# Patient Record
Sex: Male | Born: 1955
Health system: Southern US, Community
[De-identification: ages and names within clinical notes are randomized; demographics above are authoritative.]

## PROBLEM LIST (undated history)

## (undated) DIAGNOSIS — J449 Chronic obstructive pulmonary disease, unspecified: Secondary | ICD-10-CM

## (undated) DIAGNOSIS — K219 Gastro-esophageal reflux disease without esophagitis: Secondary | ICD-10-CM

## (undated) DIAGNOSIS — C3490 Malignant neoplasm of unspecified part of unspecified bronchus or lung: Secondary | ICD-10-CM

## (undated) DIAGNOSIS — R011 Cardiac murmur, unspecified: Secondary | ICD-10-CM

## (undated) HISTORY — DX: Chronic obstructive pulmonary disease, unspecified: J44.9

## (undated) HISTORY — PX: PENECTOMY: SHX741

---

## 2020-02-12 ENCOUNTER — Ambulatory Visit (INDEPENDENT_AMBULATORY_CARE_PROVIDER_SITE_OTHER): Payer: Medicare Other

## 2020-02-12 ENCOUNTER — Other Ambulatory Visit: Payer: Self-pay

## 2020-02-12 ENCOUNTER — Ambulatory Visit: Payer: Medicare Other | Admitting: Pulmonary Disease

## 2020-02-12 ENCOUNTER — Encounter: Payer: Self-pay | Admitting: Pulmonary Disease

## 2020-02-12 VITALS — BP 130/68 | HR 84 | Temp 97.6°F | Ht 69.5 in | Wt 165.0 lb

## 2020-02-12 DIAGNOSIS — J449 Chronic obstructive pulmonary disease, unspecified: Secondary | ICD-10-CM

## 2020-02-12 DIAGNOSIS — Z720 Tobacco use: Secondary | ICD-10-CM

## 2020-02-12 LAB — COMPREHENSIVE METABOLIC PANEL
ALT: 10 U/L (ref 0–53)
AST: 17 U/L (ref 0–37)
Albumin: 4.4 g/dL (ref 3.5–5.2)
Alkaline Phosphatase: 85 U/L (ref 39–117)
BUN: 12 mg/dL (ref 6–23)
CO2: 28 mEq/L (ref 19–32)
Calcium: 9.5 mg/dL (ref 8.4–10.5)
Chloride: 103 mEq/L (ref 96–112)
Creatinine, Ser: 1.22 mg/dL (ref 0.40–1.50)
GFR: 61.13 mL/min (ref >60.00)
Glucose, Bld: 94 mg/dL (ref 70–99)
Potassium: 4 mEq/L (ref 3.5–5.1)
Sodium: 136 mEq/L (ref 135–145)
Total Bilirubin: 0.5 mg/dL (ref 0.2–1.2)
Total Protein: 8.1 g/dL (ref 6.0–8.3)

## 2020-02-12 LAB — CBC WITH DIFFERENTIAL/PLATELET
Basophils Absolute: 0.1 10*3/uL (ref 0.0–0.1)
Basophils Relative: 1.2 % (ref 0.0–3.0)
Eosinophils Absolute: 0.1 10*3/uL (ref 0.0–0.7)
Eosinophils Relative: 1.2 % (ref 0.0–5.0)
HCT: 43.8 % (ref 39.0–52.0)
Hemoglobin: 14.7 g/dL (ref 13.0–17.0)
Lymphocytes Relative: 39.1 % (ref 12.0–46.0)
Lymphs Abs: 3.5 10*3/uL (ref 0.7–4.0)
MCHC: 33.7 g/dL (ref 30.0–36.0)
MCV: 93.1 fl (ref 78.0–100.0)
Monocytes Absolute: 0.7 10*3/uL (ref 0.1–1.0)
Monocytes Relative: 7.7 % (ref 3.0–12.0)
Neutro Abs: 4.5 10*3/uL (ref 1.4–7.7)
Neutrophils Relative %: 50.8 % (ref 43.0–77.0)
Platelets: 349 10*3/uL (ref 150.0–400.0)
RBC: 4.7 Mil/uL (ref 4.22–5.81)
RDW: 14.6 % (ref 11.5–15.5)
WBC: 8.9 10*3/uL (ref 4.0–10.5)

## 2020-02-12 MED ORDER — BUDESONIDE-FORMOTEROL FUMARATE 160-4.5 MCG/ACT IN AERO: 1.0000 | INHALATION_SPRAY | Freq: Two times a day (BID) | RESPIRATORY_TRACT | 2 refills | Status: DC

## 2020-02-12 MED ORDER — SPIRIVA RESPIMAT 2.5 MCG/ACT IN AERS: 1.0000 | INHALATION_SPRAY | Freq: Every day | RESPIRATORY_TRACT | 2 refills | Status: DC

## 2020-02-12 MED ORDER — ALBUTEROL SULFATE HFA 108 (90 BASE) MCG/ACT IN AERS: 1.0000 | INHALATION_SPRAY | Freq: Four times a day (QID) | RESPIRATORY_TRACT | 2 refills | Status: DC | PRN

## 2020-02-12 NOTE — Progress Notes (Signed)
Brandon Mckinney    [id number]    [birth year]  Primary Care Physician:Patient, No Pcp Per  Referring Physician: No referring provider defined for this encounter.  Chief complaint: Consult for COPD  HPI: 64 year old with COPD, GERD Self-referred for management of COPD  Diagnosed with COPD around 38 in [place].  Maintained on Symbicort, Spiriva, Ventolin rescue medication Complains of chronic dyspnea on exertion, cough with white mucus.  He recently moved from [place] and is here to establish care.  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  No mold, hot tub, Jacuzzi.  No feather pillows or comforters. Smoking history: 23-pack-year smoker.  Continues to smoke half pack per day Travel history: Grew up in [place].  Moved to [facility] around July 2021 to be closer to family after retirement Relevant family history: No significant family history of lung disease  Outpatient Encounter Medications as of 02/12/2020  Medication Sig  . albuterol (VENTOLIN HFA) 108 (90 Base) MCG/ACT inhaler Inhale 1 puff into the lungs every 6 (six) hours as needed for wheezing or shortness of breath.  . budesonide-formoterol (SYMBICORT) 160-4.5 MCG/ACT inhaler Inhale 1 puff into the lungs 2 (two) times daily.  Marland Kitchen omeprazole (PRILOSEC) 40 MG capsule Take 40 mg by mouth daily.  . Tiotropium Bromide Monohydrate (SPIRIVA RESPIMAT) 2.5 MCG/ACT AERS Inhale 1 puff into the lungs daily.   No facility-administered encounter medications on file as of 02/12/2020.    Allergies as of 02/12/2020  . (No Known Allergies)    Past Medical History:  Diagnosis Date  . COPD (chronic obstructive pulmonary disease) ([place])     History reviewed. No pertinent surgical history.  History reviewed. No pertinent family history.  Social History   Socioeconomic History  . Marital status: Married    Spouse name: Not on file  . Number of children: Not on file  . Years of  education: Not on file  . Highest education level: Not on file  Occupational History  . Not on file  Tobacco Use  . Smoking status: Current Every Day Smoker    Packs/day: 0.50    Types: Cigarettes  . Smokeless tobacco: Never Used  Substance and Sexual Activity  . Alcohol use: Not on file  . Drug use: Not on file  . Sexual activity: Not on file  Other Topics Concern  . Not on file  Social History Narrative  . Not on file   Social Determinants of Health   Financial Resource Strain:   . Difficulty of Paying Living Expenses: Not on file  Food Insecurity:   . Worried About Charity fundraiser in the Last Year: Not on file  . Ran Out of Food in the Last Year: Not on file  Transportation Needs:   . Lack of Transportation (Medical): Not on file  . Lack of Transportation (Non-Medical): Not on file  Physical Activity:   . Days of Exercise per Week: Not on file  . Minutes of Exercise per Session: Not on file  Stress:   . Feeling of Stress : Not on file  Social Connections:   . Frequency of Communication with Friends and Family: Not on file  . Frequency of Social Gatherings with Friends and Family: Not on file  . Attends Religious Services: Not on file  . Active Member of Clubs or Organizations: Not on file  . Attends Archivist Meetings: Not on file  . Marital Status:  Not on file  Intimate Partner Violence:   . Fear of Current or Ex-Partner: Not on file  . Emotionally Abused: Not on file  . Physically Abused: Not on file  . Sexually Abused: Not on file    Review of systems: Review of Systems  Constitutional: Negative for fever and chills.  HENT: Negative.   Eyes: Negative for blurred vision.  Respiratory: as per HPI  Cardiovascular: Negative for chest pain and palpitations.  Gastrointestinal: Negative for vomiting, diarrhea, blood per rectum. Genitourinary: Negative for dysuria, urgency, frequency and hematuria.  Musculoskeletal: Negative for myalgias, back pain  and joint pain.  Skin: Negative for itching and rash.  Neurological: Negative for dizziness, tremors, focal weakness, seizures and loss of consciousness.  Endo/Heme/Allergies: Negative for environmental allergies.  Psychiatric/Behavioral: Negative for depression, suicidal ideas and hallucinations.  All other systems reviewed and are negative.  Physical Exam: Blood pressure 130/68, pulse 84, temperature 97.6 F (36.4 C), temperature source Temporal, height 5' 9.5" (1.765 m), weight 165 lb (74.8 kg), SpO2 95 %. Gen:      No acute distress HEENT:  EOMI, sclera anicteric Neck:     No masses; no thyromegaly Lungs:    Diminished air entry CV:         Regular rate and rhythm; no murmurs Abd:      + bowel sounds; soft, non-tender; no palpable masses, no distension Ext:    No edema; adequate peripheral perfusion Skin:      Warm and dry; no rash Neuro: alert and oriented x 3 Psych: normal mood and affect  Data Reviewed: Imaging:  PFTs:  Labs:  Assessment:  COPD Continue Symbicort, Spiriva and albuterol as needed Check baseline labs including CBC, metabolic panel, IgE and alpha-1 antitrypsin levels and phenotype Schedule chest x-ray and PFTs  Active smoker Smoking cessation discussed but is not ready to quit.  Time spent counseling-5 minutes Reassessment at return visit  Refer for low-dose screening CT of chest.  Plan/Recommendations: Continue current inhalers CBC, metabolic panel, IgE, alpha-1 antitrypsin Chest x-ray and PFTs Refer for annual low-dose screening CT Smoking cessation.  Marshell Garfinkel MD [facility] 02/12/2020, 10:56 AM  CC: No ref. provider found

## 2020-02-12 NOTE — Patient Instructions (Signed)
We will get a chest x-ray today Schedule PFTs Refer for low-dose screening CT of the chest Check CBC differential, IgE, alpha-1 antitrypsin levels and phenotype and comprehensive metabolic panel Send in prescription for Symbicort, Spiriva and Ventolin  Please work on smoking cessation  Follow-up in 3 months

## 2020-02-15 ENCOUNTER — Telehealth: Payer: Self-pay | Admitting: Pulmonary Disease

## 2020-02-16 NOTE — Telephone Encounter (Signed)
Spoke with the pt  He states received a call yesterday, but can not remember who called or why  I do not see where anyone placed a call, his labs and cxr have not been resulted  Advised will call with with results once read  Nothing further needed

## 2020-02-18 ENCOUNTER — Telehealth: Payer: Self-pay | Admitting: Pulmonary Disease

## 2020-02-19 NOTE — Telephone Encounter (Signed)
Spoke with the pt   He states he is calling to schedule appt with Dr Vaughan Browner for Jan 2022  I advised him that the schedule is not out yet and we will let him know when it is  Nothing further needed

## 2020-02-22 LAB — IGE: IgE (Immunoglobulin E), Serum: 14 kU/L (ref <=114)

## 2020-02-22 LAB — ALPHA-1 ANTITRYPSIN PHENOTYPE: A-1 Antitrypsin, Ser: 191 mg/dL (ref 83–199)

## 2020-02-24 ENCOUNTER — Telehealth: Payer: Self-pay | Admitting: Pulmonary Disease

## 2020-02-24 NOTE — Telephone Encounter (Signed)
I had been trying to contact pt regarding his CT.  He was scheduled for 10/22.  Per SG we will hold off until she talks to [facility] tomorrow and will determine if he can be in the [facility] program.  I spoke to pt & explained this to him & told him I will call him back in the next day or 2 and now he has my direct #.  He is fine with this.  Nothing further needed for this message.

## 2020-02-26 ENCOUNTER — Inpatient Hospital Stay: Admission: RE | Admit: 2020-02-26 | Payer: Medicare Other | Source: Ambulatory Visit

## 2020-03-18 ENCOUNTER — Telehealth: Payer: Self-pay | Admitting: General Practice

## 2020-03-18 NOTE — Telephone Encounter (Signed)
Called and spoke with pharmacy to see if pt's symbicort and spiriva were ready for pt to pick up at pharmacy and was told by pharmacy staff that they were ready.  Called and spoke with pt letting him know this info and he verbalized understanding.nothing further needed.

## 2020-03-25 NOTE — Telephone Encounter (Signed)
error 

## 2020-04-14 ENCOUNTER — Other Ambulatory Visit: Payer: Self-pay | Admitting: *Deleted

## 2020-04-14 DIAGNOSIS — F17210 Nicotine dependence, cigarettes, uncomplicated: Secondary | ICD-10-CM

## 2020-04-14 DIAGNOSIS — Z87891 Personal history of nicotine dependence: Secondary | ICD-10-CM

## 2020-04-14 NOTE — Progress Notes (Signed)
Chest c 

## 2020-04-18 ENCOUNTER — Ambulatory Visit: Payer: Medicare Other | Admitting: Pulmonary Disease

## 2020-04-18 ENCOUNTER — Encounter: Payer: Self-pay | Admitting: Pulmonary Disease

## 2020-04-18 ENCOUNTER — Other Ambulatory Visit: Payer: Self-pay

## 2020-04-18 ENCOUNTER — Ambulatory Visit (INDEPENDENT_AMBULATORY_CARE_PROVIDER_SITE_OTHER): Payer: Medicare Other | Admitting: Pulmonary Disease

## 2020-04-18 VITALS — BP 138/62 | HR 81 | Temp 98.8°F | Ht 69.5 in | Wt 166.0 lb

## 2020-04-18 DIAGNOSIS — Z720 Tobacco use: Secondary | ICD-10-CM

## 2020-04-18 DIAGNOSIS — Z23 Encounter for immunization: Secondary | ICD-10-CM | POA: Diagnosis not present

## 2020-04-18 DIAGNOSIS — J449 Chronic obstructive pulmonary disease, unspecified: Secondary | ICD-10-CM

## 2020-04-18 DIAGNOSIS — F17210 Nicotine dependence, cigarettes, uncomplicated: Secondary | ICD-10-CM

## 2020-04-18 LAB — PULMONARY FUNCTION TEST
DL/VA % pred: 48 %
DL/VA: 2.01 ml/min/mmHg/L
DLCO cor % pred: 51 %
DLCO cor: 13.46 ml/min/mmHg
DLCO unc % pred: 51 %
DLCO unc: 13.46 ml/min/mmHg
FEF 25-75 Post: 0.84 L/sec
FEF 25-75 Pre: 0.85 L/sec
FEF2575-%Change-Post: -1 %
FEF2575-%Pred-Post: 32 %
FEF2575-%Pred-Pre: 32 %
FEV1-%Change-Post: 3 %
FEV1-%Pred-Post: 63 %
FEV1-%Pred-Pre: 60 %
FEV1-Post: 1.85 L
FEV1-Pre: 1.79 L
FEV1FVC-%Change-Post: 4 %
FEV1FVC-%Pred-Pre: 67 %
FEV6-%Change-Post: 0 %
FEV6-%Pred-Post: 92 %
FEV6-%Pred-Pre: 93 %
FEV6-Post: 3.41 L
FEV6-Pre: 3.43 L
FEV6FVC-%Change-Post: 0 %
FEV6FVC-%Pred-Post: 104 %
FEV6FVC-%Pred-Pre: 103 %
FVC-%Change-Post: -1 %
FVC-%Pred-Post: 88 %
FVC-%Pred-Pre: 89 %
FVC-Post: 3.41 L
FVC-Pre: 3.45 L
Post FEV1/FVC ratio: 54 %
Post FEV6/FVC ratio: 100 %
Pre FEV1/FVC ratio: 52 %
Pre FEV6/FVC Ratio: 99 %
RV % pred: 118 %
RV: 2.77 L
TLC % pred: 101 %
TLC: 7.05 L

## 2020-04-18 MED ORDER — BUPROPION HCL ER (XL) 150 MG PO TB24: ORAL_TABLET | ORAL | 0 refills | Status: DC

## 2020-04-18 MED ORDER — NICOTINE 21 MG/24HR TD PT24: 21.0000 mg | MEDICATED_PATCH | Freq: Every day | TRANSDERMAL | 0 refills | Status: DC

## 2020-04-18 NOTE — Progress Notes (Signed)
         Brandon Mckinney    [id number]    1956/03/11  Primary Care Physician:Patient, No Pcp Per  Referring Physician: No referring provider defined for this encounter.  Chief complaint: Follow-up for COPD.  HPI: 64 year old with COPD, GERD Self-referred for management of COPD  Diagnosed with COPD around 7 in [place].  Maintained on Symbicort, Spiriva, Ventolin rescue medication Complains of chronic dyspnea on exertion, cough with white mucus.  He recently moved from [place] and is here to establish care.  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  No mold, hot tub, Jacuzzi.  No feather pillows or comforters. Smoking history: 23-pack-year smoker.  Continues to smoke half pack per day Travel history: Grew up in [place].  Moved to [facility] around July 2021 to be closer to family after retirement Relevant family history: No significant family history of lung disease  Interim history: Symptoms are stable on Spiriva and Symbicort.  Continues to smoke.  Outpatient Encounter Medications as of 04/18/2020  Medication Sig  . albuterol (VENTOLIN HFA) 108 (90 Base) MCG/ACT inhaler Inhale 1 puff into the lungs every 6 (six) hours as needed for wheezing or shortness of breath.  . budesonide-formoterol (SYMBICORT) 160-4.5 MCG/ACT inhaler Inhale 1 puff into the lungs 2 (two) times daily.  Marland Kitchen omeprazole (PRILOSEC) 40 MG capsule Take 40 mg by mouth daily.  . Tiotropium Bromide Monohydrate (SPIRIVA RESPIMAT) 2.5 MCG/ACT AERS Inhale 1 puff into the lungs daily.   No facility-administered encounter medications on file as of 04/18/2020.   Physical Exam: Blood pressure 138/62, pulse 81, temperature 98.8 F (37.1 C), temperature source Skin, height 5' 9.5" (1.765 m), weight 166 lb (75.3 kg), SpO2 97 %. Gen:      No acute distress HEENT:  EOMI, sclera anicteric Neck:     No masses; no thyromegaly Lungs:    Clear to auscultation bilaterally; normal  respiratory effort CV:         Regular rate and rhythm; no murmurs Abd:      + bowel sounds; soft, non-tender; no palpable masses, no distension Ext:    No edema; adequate peripheral perfusion Skin:      Warm and dry; no rash Neuro: alert and oriented x 3 Psych: normal mood and affect  Data Reviewed: Imaging: Chest x-ray 12/13/2019-hyperinflation with no acute abnormality.  I have reviewed the images personally.  PFTs: 04/18/2020 FVC 3.41 [88%], FEV1 1.85 [63%], F/F 54, TLC 7.05 [101%], DLCO 13.46 [51%] Moderate obstruction, diffusion defect  Labs: CBC 02/12/2020-WBC 8.9, eos 1.2%, absolute eosinophil count 178 IgE 02/12/2020-14 Alpha-1 antitrypsin 02/12/2020-191, PI MM  Assessment:  COPD Continue Symbicort, Spiriva and albuterol as needed Reviewed PFTs showing moderate obstruction with patient.  Active smoker Smoking cessation discussed.  He is ready to quit at this plan.  Time spent counseling-5 minutes Has not tolerated Chantix in the past due to hallucinations Prescribe nicotine patch, Wellbutrin Reassess at return visit  Starting low-dose screening CTs in January 2021  Plan/Recommendations: Continue current inhalers Smoking cessation with nicotine patches, Wellbutrin Low-dose screening CT  Marshell Garfinkel MD [place] [facility] 04/18/2020, 11:48 AM  CC: No ref. provider found

## 2020-04-18 NOTE — Progress Notes (Signed)
Full PFT performed today. °

## 2020-04-18 NOTE — Patient Instructions (Signed)
Please work on quitting smoking We'll prescribe nicotine patches and Wellbutrin to help with quitting Continue the inhalers as prescribed  Follow-up in 6 months.

## 2020-05-10 ENCOUNTER — Other Ambulatory Visit: Payer: Self-pay | Admitting: Pulmonary Disease

## 2020-05-11 ENCOUNTER — Other Ambulatory Visit: Payer: Self-pay | Admitting: Pulmonary Disease

## 2020-05-12 ENCOUNTER — Telehealth: Payer: Self-pay | Admitting: Pulmonary Disease

## 2020-05-13 NOTE — Telephone Encounter (Signed)
Tried calling pt- line rings multiple times and no option to leave msg.

## 2020-05-18 ENCOUNTER — Other Ambulatory Visit: Payer: Self-pay

## 2020-05-18 ENCOUNTER — Ambulatory Visit
Admission: RE | Admit: 2020-05-18 | Discharge: 2020-05-18 | Disposition: A | Discharge status: Home / Self Care | Payer: Medicare Other | Source: Ambulatory Visit | Attending: Acute Care | Admitting: Acute Care

## 2020-05-18 ENCOUNTER — Encounter: Payer: Self-pay | Admitting: Acute Care

## 2020-05-18 ENCOUNTER — Ambulatory Visit (INDEPENDENT_AMBULATORY_CARE_PROVIDER_SITE_OTHER): Payer: Medicare Other | Admitting: Acute Care

## 2020-05-18 VITALS — BP 118/74 | HR 86 | Temp 97.1°F | Ht 69.5 in | Wt 169.8 lb

## 2020-05-18 DIAGNOSIS — F17210 Nicotine dependence, cigarettes, uncomplicated: Secondary | ICD-10-CM | POA: Diagnosis not present

## 2020-05-18 DIAGNOSIS — Z87891 Personal history of nicotine dependence: Secondary | ICD-10-CM

## 2020-05-18 DIAGNOSIS — Z122 Encounter for screening for malignant neoplasm of respiratory organs: Secondary | ICD-10-CM

## 2020-05-18 NOTE — Patient Instructions (Signed)
Thank you for participating in the [facility]. It was our pleasure to meet you today. We will call you with the results of your scan within the next few days. Your scan will be assigned a Lung RADS category score by the physicians reading the scans.  This Lung RADS score determines follow up scanning.  See below for description of categories, and follow up screening recommendations. We will be in touch to schedule your follow up screening annually or based on recommendations of our providers. We will fax a copy of your scan results to your Primary Care Physician, or the physician who referred you to the program, to ensure they have the results. Please call the office if you have any questions or concerns regarding your scanning experience or results.  Our office number is [phone]. Please speak with Denise Phelps, RN. She is our Lung Cancer Screening RN. If she is unavailable when you call, please have the office staff send her a message. She will return your call at her earliest convenience. Remember, if your scan is normal, we will scan you annually as long as you continue to meet the criteria for the program. (Age 67-77, Current smoker or smoker who has quit within the last 15 years). If you are a smoker, remember, quitting is the single most powerful action that you can take to decrease your risk of lung cancer and other pulmonary, breathing related problems. We know quitting is hard, and we are here to help.  Please let us know if there is anything we can do to help you meet your goal of quitting. If you are a former smoker, congratulations. We are proud of you! Remain smoke free! Remember you can refer friends or family members through the number above.  We will screen them to make sure they meet criteria for the program. Thank you for helping us take better care of you by participating in Lung Screening.  Lung RADS Categories:  Lung RADS 1: no nodules  or definitely non-concerning nodules.  Recommendation is for a repeat annual scan in 12 months.  Lung RADS 2:  nodules that are non-concerning in appearance and behavior with a very low likelihood of becoming an active cancer. Recommendation is for a repeat annual scan in 12 months.  Lung RADS 3: nodules that are probably non-concerning , includes nodules with a low likelihood of becoming an active cancer.  Recommendation is for a 6-month repeat screening scan. Often noted after an upper respiratory illness. We will be in touch to make sure you have no questions, and to schedule your 6-month scan.  Lung RADS 4 A: nodules with concerning findings, recommendation is most often for a follow up scan in 3 months or additional testing based on our provider's assessment of the scan. We will be in touch to make sure you have no questions and to schedule the recommended 3 month follow up scan.  Lung RADS 4 B:  indicates findings that are concerning. We will be in touch with you to schedule additional diagnostic testing based on our provider's  assessment of the scan.   

## 2020-05-18 NOTE — Progress Notes (Signed)
Shared Decision Making Visit Lung Cancer Screening Program [phone])   Eligibility:  Age 65 y.o.  Pack Years Smoking History Calculation 45 pack year smoking history (# packs/per year x # years smoked)  Recent History of coughing up blood  no  Unexplained weight loss? no ( >Than 15 pounds within the last 6 months )  Prior History Lung / other cancer no (Diagnosis within the last 5 years already requiring surveillance chest CT Scans).  Smoking Status: Former smoker >> Quit 2 weeks ago  Former Smokers: Years since quit: < 1 year  Quit Date: 05/04/2020  Visit Components:  Discussion included one or more decision making aids. yes  Discussion included risk/benefits of screening. yes  Discussion included potential follow up diagnostic testing for abnormal scans. yes  Discussion included meaning and risk of over diagnosis. yes  Discussion included meaning and risk of False Positives. yes  Discussion included meaning of total radiation exposure. yes  Counseling Included:  Importance of adherence to annual lung cancer LDCT screening. yes  Impact of comorbidities on ability to participate in the program. yes  Ability and willingness to under diagnostic treatment. yes  Smoking Cessation Counseling:  Current Smokers:   Discussed importance of smoking cessation. yes  Information about tobacco cessation classes and interventions provided to patient. yes  Patient provided with "ticket" for LDCT Scan. yes  Symptomatic Patient. no  Counseling  Diagnosis Code: Tobacco Use Z72.0  Asymptomatic Patient yes  Counseling (Intermediate counseling: > three minutes counseling) V6160  Former Smokers:   Discussed the importance of maintaining cigarette abstinence. yes  Diagnosis Code: Personal History of Nicotine Dependence. V37.106  Information about tobacco cessation classes and interventions provided to patient. Yes  Patient provided with "ticket" for LDCT Scan.  yes  Written Order for Lung Cancer Screening with LDCT placed in Epic. Yes (CT Chest Lung Cancer Screening Low Dose W/O CM) [id number] Z12.2-Screening of respiratory organs Z87.891-Personal history of nicotine dependence  BP 118/74 (BP Location: Left Arm, Cuff Size: Normal)   Pulse 86   Temp (!) 97.1 F (36.2 C) (Oral)   Ht 5' 9.5" (1.765 m)   Wt 169 lb 12.8 oz (77 kg)   SpO2 99%   BMI 24.72 kg/m    I spent 25 minutes of face to face time with Brandon Mckinney discussing the risks and benefits of lung cancer screening. We viewed a power point together that explained in detail the above noted topics. We took the time to pause the power point at intervals to allow for questions to be asked and answered to ensure understanding. We discussed that he had taken the single most powerful action possible to decrease his risk of developing lung cancer when he quit smoking. I counseled him to remain smoke free, and to contact me if he ever had the desire to smoke again so that I can provide resources and tools to help support the effort to remain smoke free. We discussed the time and location of the scan, and that either  Brandon Glassman RN or I will call with the results within  24-48 hours of receiving them. He has my card and contact information in the event he needs to speak with me, in addition to a copy of the power point we reviewed as a resource. He verbalized understanding of all of the above and had no further questions upon leaving the office.     I explained to the patient that there has been a high incidence of coronary artery  disease noted on these exams. I explained that this is a non-gated exam therefore degree or severity cannot be determined. This patient is not currently on statin therapy, per EPIC. I have asked the patient to follow-up with their PCP regarding any incidental finding of coronary artery disease and management with diet or medication as they feel is clinically indicated. The patient  verbalized understanding of the above and had no further questions.     Brandon Spatz, NP 05/18/2020 11:26 AM

## 2020-05-19 ENCOUNTER — Other Ambulatory Visit: Payer: Self-pay | Admitting: Acute Care

## 2020-05-19 DIAGNOSIS — E041 Nontoxic single thyroid nodule: Secondary | ICD-10-CM

## 2020-05-19 NOTE — Progress Notes (Signed)
Please call patient and let them  know their  low dose Ct was read as a Lung RADS 1, negative study: no nodules or definitely benign nodules. Radiology recommendation is for a repeat LDCT in 12 months.  .Please let them  know we will order and schedule their  annual screening scan for 05/2021. Please let them  know there was notation of CAD on their  scan.  Please remind the patient  that this is a non-gated exam therefore degree or severity of disease  cannot be determined. Please have them  follow up with their PCP regarding potential risk factor modification, dietary therapy or pharmacologic therapy if clinically indicated. Pt.  is not  currently on statin therapy. Please place order for annual  screening scan for  05/2021 and fax results to PCP. Thanks so much.  Langley Gauss, I have called the patient . I have told him about the thyroid nodule. He does not have a PCP. I am placing an order >> ambulatory referral to Primary care, and I am going to order the US thyroid.  Please place order for follow up in 1 year. We will get him a PCP.

## 2020-05-20 NOTE — Telephone Encounter (Signed)
lmtcb for pt.  May need to try for pt assistance instead of tier exceptions, as if some are approved while others arent then it may still be more than pt is able to afford. Patient assistance seems to be the best choice for pt not being able to afford his medication.

## 2020-05-23 ENCOUNTER — Telehealth: Payer: Self-pay | Admitting: General Practice

## 2020-05-23 NOTE — Telephone Encounter (Signed)
Left message voicemail for patient to call for appointment New patient referral in queue

## 2020-05-24 ENCOUNTER — Other Ambulatory Visit: Payer: Self-pay | Admitting: *Deleted

## 2020-05-24 DIAGNOSIS — Z87891 Personal history of nicotine dependence: Secondary | ICD-10-CM

## 2020-05-24 NOTE — Telephone Encounter (Addendum)
  I have printed the pt assistance forms and have them in triage. We do not mail samples of Spiriva. Need to know if he wants to pick up one, and if so does he just want to pick up forms then as well. Tried calling the pt and there was no answer and no VM, WCB

## 2020-05-24 NOTE — Telephone Encounter (Signed)
(  Pts wife has similar message) Called and spoke to pt's wife, Brandon Mckinney. Informed her of the pt assistance for the medication. She is agreeable. Pt is needing Symbicort, Spiriva, Albuterol and Omeprazole. She is requesting the forms be mailed to her for pt. Pt also only has a few days of medication left. Advised her we only have Spiriva samples at this time. She states she isnt feeling well currently and is requesting the forms and the Spiriva inhaler to be mailed to pt. Advised her this could be done.   Will forward to triage to send out pt assistance documents for all aforementioned forms and the spiriva inhaler sample. Please include the fax number in the envelope as she will fax back the completed forms.

## 2020-06-02 MED ORDER — SPIRIVA RESPIMAT 2.5 MCG/ACT IN AERS: 2.0000 | INHALATION_SPRAY | Freq: Every day | RESPIRATORY_TRACT | 0 refills | Status: DC

## 2020-06-02 NOTE — Telephone Encounter (Signed)
spiriva 2.5 samples have been placed up front for pt. Nothing further needed.

## 2020-06-02 NOTE — Telephone Encounter (Signed)
Called and spoke to pt's wife. She is aware the Spiriva cannot be mailed. Pt is ok picking up the sample (see message on pt's wife, she has same request). Pt is requesting all forms be faxed to [phone]. Pt's wife is requesting the samples be ready for pick up between 2-3p. Please call pt if samples cannot be placed up front between this time.

## 2020-06-06 ENCOUNTER — Ambulatory Visit
Admission: RE | Admit: 2020-06-06 | Discharge: 2020-06-06 | Disposition: A | Discharge status: Home / Self Care | Payer: Medicare Other | Source: Ambulatory Visit | Attending: Acute Care | Admitting: Acute Care

## 2020-06-06 DIAGNOSIS — E041 Nontoxic single thyroid nodule: Secondary | ICD-10-CM

## 2020-06-16 ENCOUNTER — Other Ambulatory Visit: Payer: Self-pay | Admitting: Acute Care

## 2020-06-16 DIAGNOSIS — E041 Nontoxic single thyroid nodule: Secondary | ICD-10-CM

## 2020-06-16 NOTE — Progress Notes (Signed)
I have called the patient with the results of his Thyroid US.I explained that the inferior left thyroid lobe was categorized as moderately suspicious, and recommendation was for a fine needle aspiration , or biopsy. I have told him I will place a referral to ENT for evaluation and follow up diagnostics. Additionally, we discussed that he needs to establish with a Primary Care Physician. I will also place an order for referral to PCP. I have told him this nodule needs definite follow up, and that if her does not get a call back from an ENT by 2/16 he needs to call our office at [phone] so that we can follow up on the referral. Pt. Verbalized understanding of the above.

## 2020-06-16 NOTE — Progress Notes (Signed)
I have called the patient to let him know there is notation on the previous referral to Primary care that they had called his phone and left a voice mail. I have asked him to follow up on that voice mail so that he can establish with primary care. I have also let him know that Primary Care at [facility] is accepting new patients. I have encouraged him to use this as an option also.

## 2020-07-15 ENCOUNTER — Other Ambulatory Visit: Payer: Self-pay | Admitting: Otolaryngology

## 2020-07-15 DIAGNOSIS — E041 Nontoxic single thyroid nodule: Secondary | ICD-10-CM

## 2020-07-27 ENCOUNTER — Ambulatory Visit
Admission: RE | Admit: 2020-07-27 | Discharge: 2020-07-27 | Disposition: A | Discharge status: Home / Self Care | Payer: Medicare Other | Source: Ambulatory Visit | Attending: Otolaryngology | Admitting: Otolaryngology

## 2020-07-27 ENCOUNTER — Other Ambulatory Visit (HOSPITAL_COMMUNITY)
Admission: RE | Admit: 2020-07-27 | Discharge: 2020-07-27 | Disposition: A | Discharge status: Home / Self Care | Payer: Medicare Other | Source: Ambulatory Visit | Attending: Otolaryngology | Admitting: Otolaryngology

## 2020-07-27 DIAGNOSIS — E041 Nontoxic single thyroid nodule: Secondary | ICD-10-CM | POA: Diagnosis present

## 2020-07-27 DIAGNOSIS — D440 Neoplasm of uncertain behavior of thyroid gland: Secondary | ICD-10-CM | POA: Diagnosis not present

## 2020-07-28 LAB — CYTOLOGY - NON PAP

## 2020-08-12 ENCOUNTER — Telehealth: Payer: Self-pay | Admitting: Acute Care

## 2020-08-12 NOTE — Telephone Encounter (Signed)
It has been sent for afirma testing. I do not have a result back. Margie, can you look for results and let me know if you find it? If we can find the results I will call the patient later today.Thanks

## 2020-08-12 NOTE — Telephone Encounter (Signed)
Spoke to patient, who is requesting results of thyroid bx.  Sarah, please advise? Thanks

## 2020-08-12 NOTE — Telephone Encounter (Signed)
I am unable to locate results.   Patient is aware that results are still pending.

## 2020-08-16 ENCOUNTER — Encounter (HOSPITAL_COMMUNITY): Payer: Self-pay

## 2020-08-17 NOTE — Telephone Encounter (Signed)
Searched patient's chart in Care Everywhere. Was able to find this information:   "Formatting of this note might be different from the original. Called patient to discuss results of recent thyroid biopsy and subsequent Afirma testing. The left lower lobe thyroid nodule measuring 4.0 cm was benign on Afirma testing. Patient was counseled that this indicates a risk of malignancy of approximately 4% and was reassured. He denies development of any symptoms. I advised continued clinical monitoring with a repeat thyroid ultrasound in 1 year based on current ATA guidelines. Patient expressed understanding and agreement.   Electronically signed by: Maryan Rued, DO 08/12/20 1205"

## 2020-08-24 NOTE — Telephone Encounter (Signed)
Sarah, please advise on results cherina found. Thanks!

## 2020-08-25 NOTE — Telephone Encounter (Signed)
I have called the patient and confirmed that he was called the report of a benign thyroid nodule by Dr. Mirian Mo Skotnicki. He confirmed he received the results. He also confirmed need for another [place] in 12 months. He and his wife have an appointment with a PCP within the next 5 days to establish care. He verbalized understanding of the above and had no further questions at completion of the call.  He will be due for lung cancer screening 05/2021

## 2020-09-13 ENCOUNTER — Ambulatory Visit: Payer: Medicare Other | Admitting: Internal Medicine

## 2020-09-27 ENCOUNTER — Ambulatory Visit (INDEPENDENT_AMBULATORY_CARE_PROVIDER_SITE_OTHER): Payer: Medicare Other | Admitting: Emergency Medicine

## 2020-09-27 ENCOUNTER — Other Ambulatory Visit: Payer: Self-pay

## 2020-09-27 ENCOUNTER — Encounter: Payer: Self-pay | Admitting: Emergency Medicine

## 2020-09-27 VITALS — BP 132/70 | HR 83 | Temp 98.1°F | Ht 69.0 in | Wt 163.0 lb

## 2020-09-27 DIAGNOSIS — E041 Nontoxic single thyroid nodule: Secondary | ICD-10-CM

## 2020-09-27 DIAGNOSIS — F17200 Nicotine dependence, unspecified, uncomplicated: Secondary | ICD-10-CM

## 2020-09-27 DIAGNOSIS — J439 Emphysema, unspecified: Secondary | ICD-10-CM

## 2020-09-27 DIAGNOSIS — I700 Atherosclerosis of aorta: Secondary | ICD-10-CM

## 2020-09-27 DIAGNOSIS — Z7689 Persons encountering health services in other specified circumstances: Secondary | ICD-10-CM | POA: Diagnosis not present

## 2020-09-27 DIAGNOSIS — Z8719 Personal history of other diseases of the digestive system: Secondary | ICD-10-CM

## 2020-09-27 MED ORDER — OMEPRAZOLE 40 MG PO CPDR: 40.0000 mg | DELAYED_RELEASE_CAPSULE | Freq: Every day | ORAL | 3 refills | Status: DC

## 2020-09-27 MED ORDER — ROSUVASTATIN CALCIUM 10 MG PO TABS: 10.0000 mg | ORAL_TABLET | Freq: Every day | ORAL | 3 refills | Status: DC

## 2020-09-27 NOTE — Assessment & Plan Note (Signed)
Benign nodule.  No treatment necessary at this time.

## 2020-09-27 NOTE — Progress Notes (Signed)
Brandon Mckinney 65 y.o.   Chief Complaint  Patient presents with  . New Patient (Initial Visit)    Pt would like a refill on prilosec and a general health check    HISTORY OF PRESENT ILLNESS: This is a 65 y.o. male first visit to this office here to establish care with me. Does not have a local primary care physician. Chronic smoker with history of COPD.  Has seen a pulmonary local doctor. Recent screening CT chest showed incidental finding of left thyroid nodule.  Was seen by ENT Dr. Had fine-needle aspiration done with negative biopsy results.  Continues to smoke. No other complaints or medical concerns today. Most recent pulmonary office visit as follows: Assessment:  COPD Continue Symbicort, Spiriva and albuterol as needed Check baseline labs including CBC, metabolic panel, IgE and alpha-1 antitrypsin levels and phenotype Schedule chest x-ray and PFTs   Active smoker Smoking cessation discussed but is not ready to quit.  Time spent counseling-5 minutes Reassessment at return visit   Refer for low-dose screening CT of chest.   Plan/Recommendations: Continue current inhalers CBC, metabolic panel, IgE, alpha-1 antitrypsin Chest x-ray and PFTs Refer for annual low-dose screening CT Smoking cessation.   Marshell Garfinkel MD [facility] 02/12/2020, 10:56 AM Recent CT chest as follows: IMPRESSION: 1. Lung-RADS 1, negative. Continue annual screening with low-dose chest CT without contrast in 12 months. 2. Suggestion of a 2.8 cm left thyroid nodule. Recommend thyroid US (ref: J Am Coll Radiol. 2015 Feb;12(2): 143-50). 3. Aortic Atherosclerosis (ICD10-I70.0) and Emphysema (ICD10-J43.9).   Recent fine-needle aspiration of thyroid nodule report as follows: IMPRESSION:  Nodule 1 (TI-RADS 4), located in the inferior left thyroid lobe,  measuring 4.0 x 2.9 x 2.4 cm meets criteria for FNA.   Procedures:  Flexible Laryngoscopy Procedure Note  Indications:  Tobacco use history, thyroid nodule, hoarseness  Risks, benefits and clinical relevance of the study were discussed. The patient understands and agrees to proceed.   Procedure: After adequate topical anesthetic was applied, 4 mm flexible laryngoscope was passed through the nasal cavity without difficulty. Flexible laryngoscopy shows patent anterior nasal cavity with minimal crusting, no discharge or infection.  Normal base of tongue and supraglottis. Mild interarytenoid edema. Normal vocal cord mobility without vocal cord nodule, mass, polyp or tumor. Supraglottic compression noted with phonation. Hypopharynx normal without mass, pooling of secretions or aspiration.  Patient tolerated procedure without complication or difficulty.  Impression & Plans:  Keanthony Poole is a 65 y.o. male with extensive tobacco use history presenting with incidental finding of TI-RADS 4 thyroid nodule of the right lower lobe on recent screening CT chest. Flexible nasolaryngoscopy performed today demonstrates mobile true vocal folds bilaterally with no evidence of mass lesion. Glottic compression noted with phonation, mild interarytenoid edema also noted consistent with reflux change. Recommend proceeding with ultrasound-guided FNA of thyroid nodule, further recommendations pending results of biopsy. FNA ordered today; I will call patient to discuss results and further treatment plans.   Meghan Donia Pounds, DO Otolaryngology   HPI   Prior to Admission medications   Medication Sig Start Date End Date Taking? Authorizing Provider  albuterol (VENTOLIN HFA) 108 (90 Base) MCG/ACT inhaler Inhale 1 puff into the lungs every 6 (six) hours as needed for wheezing or shortness of breath. 02/12/20  Yes Mannam, Praveen, MD  buPROPion (WELLBUTRIN XL) 150 MG 24 hr tablet TAKE 1 TABLET TWICE DAILY 05/11/20  Yes Mannam, Praveen, MD  omeprazole (PRILOSEC) 40 MG capsule Take 40 mg  by mouth daily.   Yes [provider]   SPIRIVA RESPIMAT 2.5 MCG/ACT AERS TAKE 1 PUFF BY MOUTH EVERY DAY 05/11/20  Yes Mannam, Praveen, MD  SYMBICORT 160-4.5 MCG/ACT inhaler TAKE 1 PUFF BY MOUTH TWICE A DAY 05/11/20  Yes Mannam, Praveen, MD  Tiotropium Bromide Monohydrate (SPIRIVA RESPIMAT) 2.5 MCG/ACT AERS Inhale 2 puffs into the lungs daily. 06/02/20  Yes Mannam, Praveen, MD  nicotine (NICODERM CQ - DOSED IN MG/24 HOURS) 21 mg/24hr patch Place 1 patch (21 mg total) onto the skin daily. Patient not taking: Reported on 09/27/2020 04/18/20   Marshell Garfinkel, MD    No Known Allergies  There are no problems to display for this patient.   Past Medical History:  Diagnosis Date  . COPD (chronic obstructive pulmonary disease) ([place])     Past Surgical History:  Procedure Laterality Date  . PENECTOMY      Social History   Socioeconomic History  . Marital status: Married    Spouse name: Not on file  . Number of children: Not on file  . Years of education: Not on file  . Highest education level: Not on file  Occupational History  . Not on file  Tobacco Use  . Smoking status: Former Smoker    Packs/day: 1.00    Years: 45.00    Pack years: 45.00    Types: Cigarettes    Start date: 1976    Quit date: 05/04/2020    Years since quitting: 0.4  . Smokeless tobacco: Never Used  . Tobacco comment: not smoking since 05/04/2020  Substance and Sexual Activity  . Alcohol use: Not on file  . Drug use: Not on file  . Sexual activity: Not on file  Other Topics Concern  . Not on file  Social History Narrative  . Not on file   Social Determinants of Health   Financial Resource Strain: Not on file  Food Insecurity: Not on file  Transportation Needs: Not on file  Physical Activity: Not on file  Stress: Not on file  Social Connections: Not on file  Intimate Partner Violence: Not on file    History reviewed. No pertinent family history.   Review of Systems  Constitutional: Negative.  Negative for chills and fever.  HENT:  Negative.  Negative for congestion and sore throat.   Respiratory: Negative.  Negative for cough and shortness of breath.   Cardiovascular: Negative.  Negative for chest pain and palpitations.  Gastrointestinal: Negative.  Negative for abdominal pain, diarrhea, nausea and vomiting.  Genitourinary: Negative.  Negative for dysuria and hematuria.  Musculoskeletal: Negative.  Negative for back pain, joint pain and myalgias.  Skin: Negative.  Negative for rash.  Neurological: Negative.  Negative for dizziness and headaches.  All other systems reviewed and are negative.    Today's Vitals   09/27/20 1615  BP: 132/70  Pulse: 83  Temp: 98.1 F (36.7 C)  TempSrc: Oral  SpO2: 97%  Weight: 163 lb (73.9 kg)  Height: 5\' 9"  (1.753 m)   Body mass index is 24.07 kg/m.   Physical Exam Vitals reviewed.  Constitutional:      Appearance: Normal appearance.  HENT:     Head: Normocephalic.  Eyes:     Extraocular Movements: Extraocular movements intact.     Pupils: Pupils are equal, round, and reactive to light.  Cardiovascular:     Rate and Rhythm: Normal rate and regular rhythm.     Pulses: Normal pulses.     Heart sounds: Normal  heart sounds.  Pulmonary:     Effort: Pulmonary effort is normal.     Breath sounds: Normal breath sounds.  Musculoskeletal:        General: Normal range of motion.     Cervical back: Normal range of motion and neck supple.  Skin:    General: Skin is warm and dry.     Capillary Refill: Capillary refill takes less than 2 seconds.  Neurological:     General: No focal deficit present.     Mental Status: He is alert and oriented to person, place, and time.  Psychiatric:        Mood and Affect: Mood normal.        Behavior: Behavior normal.      ASSESSMENT & PLAN: A total of 45 minutes was spent with the patient and counseling/coordination of care regarding establishing care with me, review of past medical history including COPD on thyroid nodules, review of  recent CT chest findings including aortic atherosclerosis and need for starting treatment, education on nutrition, review of all medications, health maintenance items, prognosis, documentation, need for follow-up.  Pulmonary emphysema ([place]) Well-controlled.  Continue Symbicort, Spiriva, and Ventolin as needed.  Atherosclerosis of aorta (HCC) Will start rosuvastatin 10 mg daily.  Diet and nutrition discussed.  Current smoker Smoking cessation advice given.  Thyroid nodule Benign nodule.  No treatment necessary at this time.  Rual was seen today for new patient (initial visit).  Diagnoses and all orders for this visit:  Pulmonary emphysema, unspecified emphysema type ([place])  Thyroid nodule  Encounter to establish care  Current smoker  Atherosclerosis of aorta (HCC) -     rosuvastatin (CRESTOR) 10 MG tablet; Take 1 tablet (10 mg total) by mouth daily.  History of gastroesophageal reflux (GERD) -     omeprazole (PRILOSEC) 40 MG capsule; Take 1 capsule (40 mg total) by mouth daily.    Patient Instructions   Health Maintenance After Age 37 After age 3, you are at a higher risk for certain long-term diseases and infections as well as injuries from falls. Falls are a major cause of broken bones and head injuries in people who are older than age 29. Getting regular preventive care can help to keep you healthy and well. Preventive care includes getting regular testing and making lifestyle changes as recommended by your health care provider. Talk with your health care provider about:  Which screenings and tests you should have. A screening is a test that checks for a disease when you have no symptoms.  A diet and exercise plan that is right for you. What should I know about screenings and tests to prevent falls? Screening and testing are the best ways to find a health problem early. Early diagnosis and treatment give you the best chance of managing medical conditions that are common  after age 46. Certain conditions and lifestyle choices may make you more likely to have a fall. Your health care provider may recommend:  Regular vision checks. Poor vision and conditions such as cataracts can make you more likely to have a fall. If you wear glasses, make sure to get your prescription updated if your vision changes.  Medicine review. Work with your health care provider to regularly review all of the medicines you are taking, including over-the-counter medicines. Ask your health care provider about any side effects that may make you more likely to have a fall. Tell your health care provider if any medicines that you take make you feel  dizzy or sleepy.  Osteoporosis screening. Osteoporosis is a condition that causes the bones to get weaker. This can make the bones weak and cause them to break more easily.  Blood pressure screening. Blood pressure changes and medicines to control blood pressure can make you feel dizzy.  Strength and balance checks. Your health care provider may recommend certain tests to check your strength and balance while standing, walking, or changing positions.  Foot health exam. Foot pain and numbness, as well as not wearing proper footwear, can make you more likely to have a fall.  Depression screening. You may be more likely to have a fall if you have a fear of falling, feel emotionally low, or feel unable to do activities that you used to do.  Alcohol use screening. Using too much alcohol can affect your balance and may make you more likely to have a fall. What actions can I take to lower my risk of falls? General instructions  Talk with your health care provider about your risks for falling. Tell your health care provider if: ? You fall. Be sure to tell your health care provider about all falls, even ones that seem minor. ? You feel dizzy, sleepy, or off-balance.  Take over-the-counter and prescription medicines only as told by your health care  provider. These include any supplements.  Eat a healthy diet and maintain a healthy weight. A healthy diet includes low-fat dairy products, low-fat (lean) meats, and fiber from whole grains, beans, and lots of fruits and vegetables. Home safety  Remove any tripping hazards, such as rugs, cords, and clutter.  Install safety equipment such as grab bars in bathrooms and safety rails on stairs.  Keep rooms and walkways well-lit. Activity  Follow a regular exercise program to stay fit. This will help you maintain your balance. Ask your health care provider what types of exercise are appropriate for you.  If you need a cane or walker, use it as recommended by your health care provider.  Wear supportive shoes that have nonskid soles.   Lifestyle  Do not drink alcohol if your health care provider tells you not to drink.  If you drink alcohol, limit how much you have: ? 0-1 drink a day for women. ? 0-2 drinks a day for men.  Be aware of how much alcohol is in your drink. In the [place] one drink equals one typical bottle of beer (12 oz), one-half glass of wine (5 oz), or one shot of hard liquor (1 oz).  Do not use any products that contain nicotine or tobacco, such as cigarettes and e-cigarettes. If you need help quitting, ask your health care provider. Summary  Having a healthy lifestyle and getting preventive care can help to protect your health and wellness after age 55.  Screening and testing are the best way to find a health problem early and help you avoid having a fall. Early diagnosis and treatment give you the best chance for managing medical conditions that are more common for people who are older than age 70.  Falls are a major cause of broken bones and head injuries in people who are older than age 47. Take precautions to prevent a fall at home.  Work with your health care provider to learn what changes you can make to improve your health and wellness and to prevent falls. This  information is not intended to replace advice given to you by your health care provider. Make sure you discuss any questions you have with  your health care provider. Document Revised: 08/14/2018 Document Reviewed: 03/06/2017 Elsevier Patient Education  2021 [place], MD [facility]

## 2020-09-27 NOTE — Assessment & Plan Note (Signed)
Smoking cessation advice given.

## 2020-09-27 NOTE — Patient Instructions (Signed)
Health Maintenance After Age 67 After age 67, you are at a higher risk for certain long-term diseases and infections as well as injuries from falls. Falls are a major cause of broken bones and head injuries in people who are older than age 67. Getting regular preventive care can help to keep you healthy and well. Preventive care includes getting regular testing and making lifestyle changes as recommended by your health care provider. Talk with your health care provider about:  Which screenings and tests you should have. A screening is a test that checks for a disease when you have no symptoms.  A diet and exercise plan that is right for you. What should I know about screenings and tests to prevent falls? Screening and testing are the best ways to find a health problem early. Early diagnosis and treatment give you the best chance of managing medical conditions that are common after age 67. Certain conditions and lifestyle choices may make you more likely to have a fall. Your health care provider may recommend:  Regular vision checks. Poor vision and conditions such as cataracts can make you more likely to have a fall. If you wear glasses, make sure to get your prescription updated if your vision changes.  Medicine review. Work with your health care provider to regularly review all of the medicines you are taking, including over-the-counter medicines. Ask your health care provider about any side effects that may make you more likely to have a fall. Tell your health care provider if any medicines that you take make you feel dizzy or sleepy.  Osteoporosis screening. Osteoporosis is a condition that causes the bones to get weaker. This can make the bones weak and cause them to break more easily.  Blood pressure screening. Blood pressure changes and medicines to control blood pressure can make you feel dizzy.  Strength and balance checks. Your health care provider may recommend certain tests to check your  strength and balance while standing, walking, or changing positions.  Foot health exam. Foot pain and numbness, as well as not wearing proper footwear, can make you more likely to have a fall.  Depression screening. You may be more likely to have a fall if you have a fear of falling, feel emotionally low, or feel unable to do activities that you used to do.  Alcohol use screening. Using too much alcohol can affect your balance and may make you more likely to have a fall. What actions can I take to lower my risk of falls? General instructions  Talk with your health care provider about your risks for falling. Tell your health care provider if: ? You fall. Be sure to tell your health care provider about all falls, even ones that seem minor. ? You feel dizzy, sleepy, or off-balance.  Take over-the-counter and prescription medicines only as told by your health care provider. These include any supplements.  Eat a healthy diet and maintain a healthy weight. A healthy diet includes low-fat dairy products, low-fat (lean) meats, and fiber from whole grains, beans, and lots of fruits and vegetables. Home safety  Remove any tripping hazards, such as rugs, cords, and clutter.  Install safety equipment such as grab bars in bathrooms and safety rails on stairs.  Keep rooms and walkways well-lit. Activity  Follow a regular exercise program to stay fit. This will help you maintain your balance. Ask your health care provider what types of exercise are appropriate for you.  If you need a cane or walker,   use it as recommended by your health care provider.  Wear supportive shoes that have nonskid soles.   Lifestyle  Do not drink alcohol if your health care provider tells you not to drink.  If you drink alcohol, limit how much you have: ? 0-1 drink a day for women. ? 0-2 drinks a day for men.  Be aware of how much alcohol is in your drink. In the [place] one drink equals one typical bottle of beer (12  oz), one-half glass of wine (5 oz), or one shot of hard liquor (1 oz).  Do not use any products that contain nicotine or tobacco, such as cigarettes and e-cigarettes. If you need help quitting, ask your health care provider. Summary  Having a healthy lifestyle and getting preventive care can help to protect your health and wellness after age 67.  Screening and testing are the best way to find a health problem early and help you avoid having a fall. Early diagnosis and treatment give you the best chance for managing medical conditions that are more common for people who are older than age 67.  Falls are a major cause of broken bones and head injuries in people who are older than age 67. Take precautions to prevent a fall at home.  Work with your health care provider to learn what changes you can make to improve your health and wellness and to prevent falls. This information is not intended to replace advice given to you by your health care provider. Make sure you discuss any questions you have with your health care provider. Document Revised: 08/14/2018 Document Reviewed: 03/06/2017 Elsevier Patient Education  2021 [employer]vier Inc[employer]  

## 2020-09-27 NOTE — Assessment & Plan Note (Signed)
Will start rosuvastatin 10 mg daily.  Diet and nutrition discussed.

## 2020-09-27 NOTE — Assessment & Plan Note (Signed)
Well-controlled.  Continue Symbicort, Spiriva, and Ventolin as needed.

## 2020-10-04 ENCOUNTER — Other Ambulatory Visit: Payer: Self-pay | Admitting: Pulmonary Disease

## 2020-12-30 ENCOUNTER — Other Ambulatory Visit: Payer: Self-pay | Admitting: Emergency Medicine

## 2020-12-30 DIAGNOSIS — Z8719 Personal history of other diseases of the digestive system: Secondary | ICD-10-CM

## 2021-03-31 ENCOUNTER — Other Ambulatory Visit: Payer: Self-pay | Admitting: Emergency Medicine

## 2021-03-31 DIAGNOSIS — Z8719 Personal history of other diseases of the digestive system: Secondary | ICD-10-CM

## 2021-04-03 ENCOUNTER — Ambulatory Visit: Payer: Medicare Other | Admitting: Emergency Medicine

## 2021-06-03 ENCOUNTER — Other Ambulatory Visit: Payer: Self-pay | Admitting: Pulmonary Disease

## 2021-06-23 ENCOUNTER — Other Ambulatory Visit: Payer: Self-pay | Admitting: Pulmonary Disease

## 2021-06-25 ENCOUNTER — Other Ambulatory Visit: Payer: Self-pay | Admitting: Pulmonary Disease

## 2021-06-27 ENCOUNTER — Telehealth: Payer: Self-pay | Admitting: Pulmonary Disease

## 2021-06-27 ENCOUNTER — Other Ambulatory Visit: Payer: Self-pay | Admitting: *Deleted

## 2021-06-27 DIAGNOSIS — Z87891 Personal history of nicotine dependence: Secondary | ICD-10-CM

## 2021-06-27 DIAGNOSIS — F17210 Nicotine dependence, cigarettes, uncomplicated: Secondary | ICD-10-CM

## 2021-06-27 MED ORDER — BUDESONIDE-FORMOTEROL FUMARATE 160-4.5 MCG/ACT IN AERO: INHALATION_SPRAY | RESPIRATORY_TRACT | 0 refills | Status: DC

## 2021-06-27 NOTE — Telephone Encounter (Signed)
Patient last seen 04/2020.  Spoke to patient, who is requesting a refill on Symbicort. He is aware that an appt is needed for further refills.  Once month supply sent to [facility].  Appt scheduled 06/30/2021 at 9:45. Nothing further needed.

## 2021-06-30 ENCOUNTER — Other Ambulatory Visit: Payer: Self-pay

## 2021-06-30 ENCOUNTER — Ambulatory Visit: Payer: Medicare Other | Admitting: Pulmonary Disease

## 2021-06-30 ENCOUNTER — Encounter: Payer: Self-pay | Admitting: Pulmonary Disease

## 2021-06-30 VITALS — BP 128/64 | HR 87 | Temp 98.4°F | Ht 69.5 in | Wt 160.4 lb

## 2021-06-30 DIAGNOSIS — Z8719 Personal history of other diseases of the digestive system: Secondary | ICD-10-CM

## 2021-06-30 DIAGNOSIS — J449 Chronic obstructive pulmonary disease, unspecified: Secondary | ICD-10-CM | POA: Diagnosis not present

## 2021-06-30 DIAGNOSIS — F17210 Nicotine dependence, cigarettes, uncomplicated: Secondary | ICD-10-CM | POA: Diagnosis not present

## 2021-06-30 MED ORDER — OMEPRAZOLE 40 MG PO CPDR: 40.0000 mg | DELAYED_RELEASE_CAPSULE | Freq: Every day | ORAL | 3 refills | Status: DC

## 2021-06-30 MED ORDER — BUDESONIDE-FORMOTEROL FUMARATE 160-4.5 MCG/ACT IN AERO: INHALATION_SPRAY | RESPIRATORY_TRACT | 3 refills | Status: DC

## 2021-06-30 MED ORDER — SPIRIVA RESPIMAT 2.5 MCG/ACT IN AERS: 2.0000 | INHALATION_SPRAY | Freq: Every day | RESPIRATORY_TRACT | 3 refills | Status: DC

## 2021-06-30 MED ORDER — ALBUTEROL SULFATE HFA 108 (90 BASE) MCG/ACT IN AERS: 1.0000 | INHALATION_SPRAY | Freq: Four times a day (QID) | RESPIRATORY_TRACT | 3 refills | Status: DC | PRN

## 2021-06-30 NOTE — Patient Instructions (Addendum)
I am glad you are stable with regard to breathing We will renew your inhalers Continue to work on smoking cessation Follow-up in 1 year

## 2021-06-30 NOTE — Progress Notes (Signed)
Brandon Mckinney    [id number]    1956/01/08  Primary Care Physician:Sagardia, Ines Bloomer, MD  Referring Physician: Horald Pollen, [place],  [place] [id number]  Chief complaint: Follow-up for COPD.  HPI: 66 year old with COPD, GERD Self-referred for management of COPD  Diagnosed with COPD around 55 in [place].  Maintained on Symbicort, Spiriva, Ventolin rescue medication Complains of chronic dyspnea on exertion, cough with white mucus.  He recently moved from [place] and is here to establish care.  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  No mold, hot tub, Jacuzzi.  No feather pillows or comforters. Smoking history: 23-pack-year smoker.  Continues to smoke half pack per day Travel history: Grew up in [place].  Moved to [facility] around July 2021 to be closer to family after retirement Relevant family history: No significant family history of lung disease  Interim history: Symptoms are stable on Spiriva and Symbicort.  Continues to smoke.  He had a screening CT last year which showed a thyroid nodule and underwent fine-needle aspiration with no evidence of malignancy.  This is being followed by his primary care  Outpatient Encounter Medications as of 06/30/2021  Medication Sig   budesonide-formoterol (SYMBICORT) 160-4.5 MCG/ACT inhaler INHALE 1 PUFF BY MOUTH TWICE A DAY   buPROPion (WELLBUTRIN XL) 150 MG 24 hr tablet TAKE 1 TABLET TWICE DAILY   nicotine (NICODERM CQ - DOSED IN MG/24 HOURS) 21 mg/24hr patch Place 1 patch (21 mg total) onto the skin daily.   omeprazole (PRILOSEC) 40 MG capsule TAKE 1 CAPSULE (40 MG TOTAL) BY MOUTH DAILY.   rosuvastatin (CRESTOR) 10 MG tablet Take 1 tablet (10 mg total) by mouth daily.   SPIRIVA RESPIMAT 2.5 MCG/ACT AERS INHALE 1 PUFF BY MOUTH EVERY DAY   [DISCONTINUED] Tiotropium Bromide Monohydrate (SPIRIVA RESPIMAT) 2.5 MCG/ACT AERS Inhale 2 puffs into the  lungs daily.   albuterol (VENTOLIN HFA) 108 (90 Base) MCG/ACT inhaler Inhale 1 puff into the lungs every 6 (six) hours as needed for wheezing or shortness of breath. (Patient not taking: Reported on 06/30/2021)   No facility-administered encounter medications on file as of 06/30/2021.   Physical Exam: Blood pressure 128/64, pulse 87, temperature 98.4 F (36.9 C), temperature source Oral, height 5' 9.5" (1.765 m), weight 160 lb 6.4 oz (72.8 kg), SpO2 97 %. Gen:      No acute distress HEENT:  EOMI, sclera anicteric Neck:     No masses; no thyromegaly Lungs:    Clear to auscultation bilaterally; normal respiratory effort CV:         Regular rate and rhythm; no murmurs Abd:      + bowel sounds; soft, non-tender; no palpable masses, no distension Ext:    No edema; adequate peripheral perfusion Skin:      Warm and dry; no rash Neuro: alert and oriented x 3 Psych: normal mood and affect   Data Reviewed: Imaging: Chest x-ray 12/13/2019-hyperinflation with no acute abnormality.  Screening CT chest 05/18/2020-left thyroid nodule, emphysema.  No pulmonary nodules I have reviewed the images personally.  PFTs: 04/18/2020 FVC 3.41 [88%], FEV1 1.85 [63%], F/F 54, TLC 7.05 [101%], DLCO 13.46 [51%] Moderate obstruction, diffusion defect  Labs: CBC 02/12/2020-WBC 8.9, eos 1.2%, absolute eosinophil count 178 IgE 02/12/2020-14 Alpha-1 antitrypsin 02/12/2020-191, PI MM  Assessment:  COPD Continue Symbicort, Spiriva and albuterol as needed Reviewed PFTs showing moderate obstruction  Active smoker Smoking  cessation discussed.  He is ready to quit at this plan.  Time spent counseling-5 minutes Has not tolerated Chantix in the past due to hallucinations He has Wellbutrin and nicotine patches but has not used it yellow.  He is afraid to quit smoking as it causes weight gain Reassess at return visit  Low-dose screening CT follow-up scheduled for next month  Plan/Recommendations: Continue current  inhalers Smoking cessation Low-dose screening CT  Marshell Garfinkel MD  Pulmonary and Critical Care 06/30/2021, 10:11 AM  CC: Horald Pollen, *

## 2021-06-30 NOTE — Addendum Note (Signed)
Addended by: Elton Sin on: 06/30/2021 10:32 AM   Modules accepted: Orders

## 2021-07-13 ENCOUNTER — Ambulatory Visit
Admission: RE | Admit: 2021-07-13 | Discharge: 2021-07-13 | Disposition: A | Discharge status: Home / Self Care | Payer: Medicare Other | Source: Ambulatory Visit | Attending: Acute Care | Admitting: Acute Care

## 2021-07-13 DIAGNOSIS — Z87891 Personal history of nicotine dependence: Secondary | ICD-10-CM

## 2021-07-13 DIAGNOSIS — F17210 Nicotine dependence, cigarettes, uncomplicated: Secondary | ICD-10-CM | POA: Diagnosis not present

## 2021-07-17 ENCOUNTER — Other Ambulatory Visit: Payer: Self-pay

## 2021-07-17 DIAGNOSIS — F17210 Nicotine dependence, cigarettes, uncomplicated: Secondary | ICD-10-CM

## 2021-07-17 DIAGNOSIS — Z87891 Personal history of nicotine dependence: Secondary | ICD-10-CM

## 2021-09-08 ENCOUNTER — Ambulatory Visit (INDEPENDENT_AMBULATORY_CARE_PROVIDER_SITE_OTHER): Payer: Medicare Other | Admitting: Emergency Medicine

## 2021-09-08 ENCOUNTER — Encounter: Payer: Self-pay | Admitting: *Deleted

## 2021-09-08 VITALS — Ht 69.5 in

## 2021-09-08 DIAGNOSIS — Z0000 Encounter for general adult medical examination without abnormal findings: Secondary | ICD-10-CM

## 2021-09-08 NOTE — Patient Instructions (Signed)
Health Maintenance, Male Adopting a healthy lifestyle and getting preventive care are important in promoting health and wellness. Ask your health care provider about: The right schedule for you to have regular tests and exams. Things you can do on your own to prevent diseases and keep yourself healthy. What should I know about diet, weight, and exercise? Eat a healthy diet  Eat a diet that includes plenty of vegetables, fruits, low-fat dairy products, and lean protein. Do not eat a lot of foods that are high in solid fats, added sugars, or sodium. Maintain a healthy weight Body mass index (BMI) is a measurement that can be used to identify possible weight problems. It estimates body fat based on height and weight. Your health care provider can help determine your BMI and help you achieve or maintain a healthy weight. Get regular exercise Get regular exercise. This is one of the most important things you can do for your health. Most adults should: Exercise for at least 150 minutes each week. The exercise should increase your heart rate and make you sweat (moderate-intensity exercise). Do strengthening exercises at least twice a week. This is in addition to the moderate-intensity exercise. Spend less time sitting. Even light physical activity can be beneficial. Watch cholesterol and blood lipids Have your blood tested for lipids and cholesterol at 68 years of age, then have this test every 5 years. You may need to have your cholesterol levels checked more often if: Your lipid or cholesterol levels are high. You are older than 68 years of age. You are at high risk for heart disease. What should I know about cancer screening? Many types of cancers can be detected early and may often be prevented. Depending on your health history and family history, you may need to have cancer screening at various ages. This may include screening for: Colorectal cancer. Prostate cancer. Skin cancer. Lung  cancer. What should I know about heart disease, diabetes, and high blood pressure? Blood pressure and heart disease High blood pressure causes heart disease and increases the risk of stroke. This is more likely to develop in people who have high blood pressure readings or are overweight. Talk with your health care provider about your target blood pressure readings. Have your blood pressure checked: Every 3-5 years if you are 18-39 years of age. Every year if you are 40 years old or older. If you are between the ages of 65 and 75 and are a current or former smoker, ask your health care provider if you should have a one-time screening for abdominal aortic aneurysm (AAA). Diabetes Have regular diabetes screenings. This checks your fasting blood sugar level. Have the screening done: Once every three years after age 45 if you are at a normal weight and have a low risk for diabetes. More often and at a younger age if you are overweight or have a high risk for diabetes. What should I know about preventing infection? Hepatitis B If you have a higher risk for hepatitis B, you should be screened for this virus. Talk with your health care provider to find out if you are at risk for hepatitis B infection. Hepatitis C Blood testing is recommended for: Everyone born from 1945 through 1965. Anyone with known risk factors for hepatitis C. Sexually transmitted infections (STIs) You should be screened each year for STIs, including gonorrhea and chlamydia, if: You are sexually active and are younger than 68 years of age. You are older than 68 years of age and your   health care provider tells you that you are at risk for this type of infection. Your sexual activity has changed since you were last screened, and you are at increased risk for chlamydia or gonorrhea. Ask your health care provider if you are at risk. Ask your health care provider about whether you are at high risk for HIV. Your health care provider  may recommend a prescription medicine to help prevent HIV infection. If you choose to take medicine to prevent HIV, you should first get tested for HIV. You should then be tested every 3 months for as long as you are taking the medicine. Follow these instructions at home: Alcohol use Do not drink alcohol if your health care provider tells you not to drink. If you drink alcohol: Limit how much you have to 0-2 drinks a day. Know how much alcohol is in your drink. In the [place] one drink equals one 12 oz bottle of beer (355 mL), one 5 oz glass of wine (148 mL), or one 1 oz glass of hard liquor (44 mL). Lifestyle Do not use any products that contain nicotine or tobacco. These products include cigarettes, chewing tobacco, and vaping devices, such as e-cigarettes. If you need help quitting, ask your health care provider. Do not use street drugs. Do not share needles. Ask your health care provider for help if you need support or information about quitting drugs. General instructions Schedule regular health, dental, and eye exams. Stay current with your vaccines. Tell your health care provider if: You often feel depressed. You have ever been abused or do not feel safe at home. Summary Adopting a healthy lifestyle and getting preventive care are important in promoting health and wellness. Follow your health care provider's instructions about healthy diet, exercising, and getting tested or screened for diseases. Follow your health care provider's instructions on monitoring your cholesterol and blood pressure. This information is not intended to replace advice given to you by your health care provider. Make sure you discuss any questions you have with your health care provider. Document Revised: 09/12/2020 Document Reviewed: 09/12/2020 Elsevier Patient Education  2023 Elsevier Inc.  

## 2021-09-08 NOTE — Progress Notes (Signed)
? ?Subjective:  ? Brandon Mckinney is a 66 y.o. male who presents for Medicare Annual/Subsequent preventive examination. ?I connected with  Brandon Mckinney on 09/08/21 by a audio enabled telemedicine application and verified that I am speaking with the correct person using two identifiers. ? ?Patient Location: Home ? ?Provider Location: Office/Clinic ? ?I discussed the limitations of evaluation and management by telemedicine. The patient expressed understanding and agreed to proceed.  ? ? ?Defer to PCP ?  ? ?   ?Objective:  ?  ?There were no vitals filed for this visit. ?There is no height or weight on file to calculate BMI. ? ? ?  09/08/2021  ?  9:49 AM  ?Advanced Directives  ?Does Patient Have a Medical Advance Directive? No  ?Would patient like information on creating a medical advance directive? No - Patient declined  ? ? ?Current Medications (verified) ?Outpatient Encounter Medications as of 09/08/2021  ?Medication Sig  ? albuterol (VENTOLIN HFA) 108 (90 Base) MCG/ACT inhaler Inhale 1 puff into the lungs every 6 (six) hours as needed for wheezing or shortness of breath.  ? budesonide-formoterol (SYMBICORT) 160-4.5 MCG/ACT inhaler INHALE 1 PUFF BY MOUTH TWICE A DAY  ? buPROPion (WELLBUTRIN XL) 150 MG 24 hr tablet TAKE 1 TABLET TWICE DAILY  ? omeprazole (PRILOSEC) 40 MG capsule Take 1 capsule (40 mg total) by mouth daily.  ? rosuvastatin (CRESTOR) 10 MG tablet Take 1 tablet (10 mg total) by mouth daily.  ? Tiotropium Bromide Monohydrate (SPIRIVA RESPIMAT) 2.5 MCG/ACT AERS Inhale 2 puffs into the lungs daily.  ? nicotine (NICODERM CQ - DOSED IN MG/24 HOURS) 21 mg/24hr patch Place 1 patch (21 mg total) onto the skin daily. (Patient not taking: Reported on 09/08/2021)  ? ?No facility-administered encounter medications on file as of 09/08/2021.  ? ? ?Allergies (verified) ?Patient has no known allergies.  ? ?History: ?Past Medical History:  ?Diagnosis Date  ? COPD (chronic obstructive pulmonary disease) ([place])   ? ?Past Surgical  History:  ?Procedure Laterality Date  ? PENECTOMY    ? ?History reviewed. No pertinent family history. ?Social History  ? ?Socioeconomic History  ? Marital status: Married  ?  Spouse name: Not on file  ? Number of children: Not on file  ? Years of education: Not on file  ? Highest education level: Not on file  ?Occupational History  ? Not on file  ?Tobacco Use  ? Smoking status: Every Day  ?  Packs/day: 1.00  ?  Years: 45.00  ?  Pack years: 45.00  ?  Types: Cigarettes  ?  Start date: 2  ? Smokeless tobacco: Never  ?Vaping Use  ? Vaping Use: Never used  ?Substance and Sexual Activity  ? Alcohol use: Never  ? Drug use: Never  ? Sexual activity: Yes  ?Other Topics Concern  ? Not on file  ?Social History Narrative  ? Not on file  ? ?Social Determinants of Health  ? ?Financial Resource Strain: Low Risk   ? Difficulty of Paying Living Expenses: Not hard at all  ?Food Insecurity: No Food Insecurity  ? Worried About Charity fundraiser in the Last Year: Never true  ? Ran Out of Food in the Last Year: Never true  ?Transportation Needs: No Transportation Needs  ? Lack of Transportation (Medical): No  ? Lack of Transportation (Non-Medical): No  ?Physical Activity: Inactive  ? Days of Exercise per Week: 0 days  ? Minutes of Exercise per Session: 0 min  ?Stress: No Stress Concern  Present  ? Feeling of Stress : Not at all  ?Social Connections: Moderately Isolated  ? Frequency of Communication with Friends and Family: Once a week  ? Frequency of Social Gatherings with Friends and Family: Once a week  ? Attends Religious Services: 1 to 4 times per year  ? Active Member of Clubs or Organizations: No  ? Attends Archivist Meetings: Never  ? Marital Status: Married  ? ? ?Tobacco Counseling ?Ready to quit: Not Answered ?Counseling given: Not Answered ? ? ?Clinical Intake: ? ?  ? ?Pain : No/denies pain ? ?  ? ?Diabetes: No ? ?How often do you need to have someone help you when you read instructions, pamphlets, or other  written materials from your doctor or pharmacy?: 1 - Never ? ?Diabetic?no ? ?Interpreter Needed?: No ? ?  ? ? ?Activities of Daily Living ? ?  09/08/2021  ?  9:49 AM  ?In your present state of health, do you have any difficulty performing the following activities:  ?Hearing? 0  ?Vision? 0  ?Difficulty concentrating or making decisions? 0  ?Walking or climbing stairs? 0  ?Dressing or bathing? 0  ?Doing errands, shopping? 0  ? ? ?Patient Care Team: ?Horald Pollen, MD as PCP - General (Internal Medicine) ? ?Indicate any recent Medical Services you may have received from other than [facility] providers in the past year (date may be approximate). ? ?   ?Assessment:  ? This is a routine wellness examination for [facility]. ? ?Hearing/Vision screen ?No results found. ? ?Dietary issues and exercise activities discussed: ?Current Exercise Habits: The patient does not participate in regular exercise at present, Exercise limited by: None identified ? ? Goals Addressed   ?None ?  ?Depression Screen ? ?  09/08/2021  ?  9:23 AM 09/27/2020  ?  4:17 PM  ?PHQ 2/9 Scores  ?PHQ - 2 Score 0 0  ?  ?Fall Risk ? ?  09/08/2021  ?  9:49 AM 09/08/2021  ?  9:23 AM 09/27/2020  ?  4:17 PM  ?Fall Risk   ?Falls in the past year? 0 0 0  ?Number falls in past yr:   0  ?Injury with Fall?   0  ? ? ?FALL RISK PREVENTION PERTAINING TO THE HOME: ? ?Any stairs in or around the home? Yes  ?If so, are there any without handrails? No  ?Home free of loose throw rugs in walkways, pet beds, electrical cords, etc? No  ?Adequate lighting in your home to reduce risk of falls? Yes  ? ?ASSISTIVE DEVICES UTILIZED TO PREVENT FALLS: ? ?Life alert? No  ?Use of a cane, walker or w/c? No  ?Grab bars in the bathroom? No  ?Shower chair or bench in shower? Yes  ?Elevated toilet seat or a handicapped toilet? No  ? ?TIMED UP AND GO: ? ?Was the test performed? No .  ?Length of time to ambulate 10 feet: n/a sec.  ? ? ? ?Cognitive Function: ?  ?  ? ?  09/08/2021  ?  9:50 AM  ?6CIT Screen   ?What Year? 0 points  ?What month? 0 points  ?What time? 0 points  ?Count back from 20 0 points  ?Months in reverse 0 points  ?Repeat phrase 0 points  ?Total Score 0 points  ? ? ?Immunizations ?Immunization History  ?Administered Date(s) Administered  ? Fluad Quad(high Dose 65+) 04/18/2020  ? Janssen (J&J) SARS-COV-2 Vaccination 02/12/2020  ? ? ?TDAP status: Due, Education has been provided regarding the  importance of this vaccine. Advised may receive this vaccine at local pharmacy or Health Dept. Aware to provide a copy of the vaccination record if obtained from local pharmacy or Health Dept. Verbalized acceptance and understanding. ? ?Flu Vaccine status: Due, Education has been provided regarding the importance of this vaccine. Advised may receive this vaccine at local pharmacy or Health Dept. Aware to provide a copy of the vaccination record if obtained from local pharmacy or Health Dept. Verbalized acceptance and understanding. ? ?Pneumococcal vaccine status: Due, Education has been provided regarding the importance of this vaccine. Advised may receive this vaccine at local pharmacy or Health Dept. Aware to provide a copy of the vaccination record if obtained from local pharmacy or Health Dept. Verbalized acceptance and understanding. ? ?Covid-19 vaccine status: Completed vaccines ? ?Qualifies for Shingles Vaccine? yes ?Zostavax completed No   ?Shingrix Completed?: No.    Education has been provided regarding the importance of this vaccine. Patient has been advised to call insurance company to determine out of pocket expense if they have not yet received this vaccine. Advised may also receive vaccine at local pharmacy or Health Dept. Verbalized acceptance and understanding. ? ?Screening Tests ?Health Maintenance  ?Topic Date Due  ? Pneumonia Vaccine 80+ Years old (1 - PCV) Never done  ? COLONOSCOPY (Pts 45-42yr Insurance coverage will need to be confirmed)  Never done  ? Zoster Vaccines- Shingrix (1 of 2)  Never done  ? COVID-19 Vaccine (2 - Booster for Janssen series) 04/08/2020  ? TETANUS/TDAP  09/27/2021 (Originally 06/27/1974)  ? Hepatitis C Screening  09/27/2021 (Originally 06/28/1973)  ? INFLUENZA VACCINE  08/01/202

## 2021-09-25 ENCOUNTER — Other Ambulatory Visit: Payer: Self-pay | Admitting: Emergency Medicine

## 2021-09-25 DIAGNOSIS — I700 Atherosclerosis of aorta: Secondary | ICD-10-CM

## 2021-11-22 ENCOUNTER — Encounter: Payer: Self-pay | Admitting: Emergency Medicine

## 2021-11-22 ENCOUNTER — Ambulatory Visit (INDEPENDENT_AMBULATORY_CARE_PROVIDER_SITE_OTHER): Payer: Medicare Other

## 2021-11-22 ENCOUNTER — Ambulatory Visit (INDEPENDENT_AMBULATORY_CARE_PROVIDER_SITE_OTHER): Payer: Medicare Other | Admitting: Emergency Medicine

## 2021-11-22 VITALS — BP 148/60 | HR 77 | Temp 98.1°F | Ht 69.5 in | Wt 158.0 lb

## 2021-11-22 DIAGNOSIS — J441 Chronic obstructive pulmonary disease with (acute) exacerbation: Secondary | ICD-10-CM | POA: Insufficient documentation

## 2021-11-22 DIAGNOSIS — R059 Cough, unspecified: Secondary | ICD-10-CM | POA: Diagnosis not present

## 2021-11-22 DIAGNOSIS — F17200 Nicotine dependence, unspecified, uncomplicated: Secondary | ICD-10-CM

## 2021-11-22 DIAGNOSIS — J22 Unspecified acute lower respiratory infection: Secondary | ICD-10-CM | POA: Insufficient documentation

## 2021-11-22 DIAGNOSIS — J449 Chronic obstructive pulmonary disease, unspecified: Secondary | ICD-10-CM | POA: Diagnosis not present

## 2021-11-22 MED ORDER — PREDNISONE 20 MG PO TABS
40.0000 mg | ORAL_TABLET | Freq: Every day | ORAL | 0 refills | Status: AC
Start: 2021-11-22 — End: 2021-11-27

## 2021-11-22 MED ORDER — AZITHROMYCIN 250 MG PO TABS: ORAL_TABLET | ORAL | 0 refills | Status: DC

## 2021-11-22 NOTE — Patient Instructions (Signed)
  Acute Bronchitis, Adult  Acute bronchitis is when air tubes in the lungs (bronchi) suddenly get swollen. The condition can make it hard for you to breathe. In adults, acute bronchitis usually goes away within 2 weeks. A cough caused by bronchitis may last up to 3 weeks. Smoking, allergies, and asthma can make the condition worse. What are the causes? Germs that cause cold and flu (viruses). The most common cause of this condition is the virus that causes the common cold. Bacteria. Substances that bother (irritate) the lungs, including: Smoke from cigarettes and other types of tobacco. Dust and pollen. Fumes from chemicals, gases, or burned fuel. Indoor or outdoor air pollution. What increases the risk? A weak body's defense system. This is also called the immune system. Any condition that affects your lungs and breathing, such as asthma. What are the signs or symptoms? A cough. Coughing up clear, yellow, or green mucus. Making high-pitched whistling sounds when you breathe, most often when you breathe out (wheezing). Runny or stuffy nose. Having too much mucus in your lungs (chest congestion). Shortness of breath. Body aches. A sore throat. How is this treated? Acute bronchitis may go away over time without treatment. Your doctor may tell you to: Drink more fluids. This will help thin your mucus so it is easier to cough up. Use a device that gets medicine into your lungs (inhaler). Use a vaporizer or a humidifier. These are machines that add water to the air. This helps with coughing and poor breathing. Take a medicine that thins mucus and helps clear it from your lungs. Take a medicine that prevents or stops coughing. It is not common to take an antibiotic medicine for this condition. Follow these instructions at home:  Take over-the-counter and prescription medicines only as told by your doctor. Use an inhaler, vaporizer, or humidifier as told by your doctor. Take two  teaspoons (10 mL) of honey at bedtime. This helps lessen your coughing at night. Drink enough fluid to keep your pee (urine) pale yellow. Do not smoke or use any products that contain nicotine or tobacco. If you need help quitting, ask your doctor. Get a lot of rest. Return to your normal activities when your doctor says that it is safe. Keep all follow-up visits. How is this prevented?  Wash your hands often with soap and water for at least 20 seconds. If you cannot use soap and water, use hand sanitizer. Avoid contact with people who have cold symptoms. Try not to touch your mouth, nose, or eyes with your hands. Avoid breathing in smoke or chemical fumes. Make sure to get the flu shot every year. Contact a doctor if: Your symptoms do not get better in 2 weeks. You have trouble coughing up the mucus. Your cough keeps you awake at night. You have a fever. Get help right away if: You cough up blood. You have chest pain. You have very bad shortness of breath. You faint or keep feeling like you are going to faint. You have a very bad headache. Your fever or chills get worse. These symptoms may be an emergency. Get help right away. Call your local emergency services (911 in the [place] Do not wait to see if the symptoms will go away. Do not drive yourself to the hospital. Summary Acute bronchitis is when air tubes in the lungs (bronchi) suddenly get swollen. In adults, acute bronchitis usually goes away within 2 weeks. Drink more fluids. This will help thin your mucus so it   is easier to cough up. Take over-the-counter and prescription medicines only as told by your doctor. Contact a doctor if your symptoms do not improve after 2 weeks of treatment. This information is not intended to replace advice given to you by your health care provider. Make sure you discuss any questions you have with your health care provider. Document Revised: 08/24/2020 Document Reviewed: 08/24/2020 Elsevier  Patient Education  2023 Elsevier Inc.  

## 2021-11-22 NOTE — Progress Notes (Signed)
Brandon Mckinney 66 y.o.   Chief Complaint  Patient presents with   Cough    With yellow mucous, no other symptoms    HISTORY OF PRESENT ILLNESS: This is a 66 y.o. male with history of COPD complaining of 1 week history of productive cough some wheezing and some difficulty breathing.  Using rescue inhaler more often. Denies fever or chills.  Able to eat and drink.  Denies nausea or vomiting. No other complaints or medical concerns today  Cough Associated symptoms include shortness of breath and wheezing. Pertinent negatives include no chest pain, chills, fever, headaches, hemoptysis, rash or sore throat.     Prior to Admission medications   Medication Sig Start Date End Date Taking? Authorizing Provider  albuterol (VENTOLIN HFA) 108 (90 Base) MCG/ACT inhaler Inhale 1 puff into the lungs every 6 (six) hours as needed for wheezing or shortness of breath. 06/30/21  Yes Mannam, Praveen, MD  budesonide-formoterol (SYMBICORT) 160-4.5 MCG/ACT inhaler INHALE 1 PUFF BY MOUTH TWICE A DAY 06/30/21  Yes Mannam, Praveen, MD  buPROPion (WELLBUTRIN XL) 150 MG 24 hr tablet TAKE 1 TABLET TWICE DAILY 05/11/20  Yes Mannam, Praveen, MD  omeprazole (PRILOSEC) 40 MG capsule Take 1 capsule (40 mg total) by mouth daily. 06/30/21  Yes Mannam, Praveen, MD  Tiotropium Bromide Monohydrate (SPIRIVA RESPIMAT) 2.5 MCG/ACT AERS Inhale 2 puffs into the lungs daily. 06/30/21  Yes Marshell Garfinkel, MD    No Known Allergies  Patient Active Problem List   Diagnosis Date Noted   Pulmonary emphysema ([place]) 09/27/2020   Thyroid nodule 09/27/2020   Current smoker 09/27/2020   Atherosclerosis of aorta ([place]) 09/27/2020    Past Medical History:  Diagnosis Date   COPD (chronic obstructive pulmonary disease) (HCC)     Past Surgical History:  Procedure Laterality Date   PENECTOMY      Social History   Socioeconomic History   Marital status: Married    Spouse name: Not on file   Number of children: Not on file   Years of  education: Not on file   Highest education level: Not on file  Occupational History   Not on file  Tobacco Use   Smoking status: Every Day    Packs/day: 1.00    Years: 45.00    Total pack years: 45.00    Types: Cigarettes    Start date: 1976   Smokeless tobacco: Never  Vaping Use   Vaping Use: Never used  Substance and Sexual Activity   Alcohol use: Never   Drug use: Never   Sexual activity: Yes  Other Topics Concern   Not on file  Social History Narrative   Not on file   Social Determinants of Health   Financial Resource Strain: Low Risk  (09/08/2021)   Overall Financial Resource Strain (CARDIA)    Difficulty of Paying Living Expenses: Not hard at all  Food Insecurity: No Food Insecurity (09/08/2021)   Hunger Vital Sign    Worried About Running Out of Food in the Last Year: Never true    [place] in the Last Year: Never true  Transportation Needs: No Transportation Needs (09/08/2021)   PRAPARE - Hydrologist (Medical): No    Lack of Transportation (Non-Medical): No  Physical Activity: Inactive (09/08/2021)   Exercise Vital Sign    Days of Exercise per Week: 0 days    Minutes of Exercise per Session: 0 min  Stress: No Stress Concern Present (09/08/2021)   [place]  [employer]ational Health - Occupational Stress Questionnaire    Feeling of Stress : Not at all  Social Connections: Moderately Isolated (09/08/2021)   Social Connection and Isolation Panel [NHANES]    Frequency of Communication with Friends and Family: Once a week    Frequency of Social Gatherings with Friends and Family: Once a week    Attends Religious Services: 1 to 4 times per year    Active Member of [employer] or Organizations: No    Attends Archivist Meetings: Never    Marital Status: Married  Human resources officer Violence: Not At Risk (09/08/2021)   Humiliation, Afraid, Rape, and Kick questionnaire    Fear of Current or Ex-Partner: No    Emotionally Abused: No     Physically Abused: No    Sexually Abused: No    No family history on file.   Review of Systems  Constitutional: Negative.  Negative for chills and fever.  HENT: Negative.  Negative for congestion and sore throat.   Respiratory:  Positive for cough, sputum production, shortness of breath and wheezing. Negative for hemoptysis.   Cardiovascular: Negative.  Negative for chest pain and palpitations.  Gastrointestinal:  Negative for abdominal pain, diarrhea, nausea and vomiting.  Genitourinary: Negative.   Skin: Negative.  Negative for rash.  Neurological:  Negative for dizziness and headaches.  All other systems reviewed and are negative.  Today's Vitals   11/22/21 1427  BP: (!) 148/60  Pulse: 77  Temp: 98.1 F (36.7 C)  TempSrc: Oral  SpO2: 95%  Weight: 158 lb (71.7 kg)  Height: 5' 9.5" (1.765 m)   Body mass index is 23 kg/m.   Physical Exam Vitals reviewed.  Constitutional:      Appearance: Normal appearance.  HENT:     Head: Normocephalic.     Mouth/Throat:     Mouth: Mucous membranes are moist.     Pharynx: Oropharynx is clear.  Eyes:     Extraocular Movements: Extraocular movements intact.     Conjunctiva/sclera: Conjunctivae normal.     Pupils: Pupils are equal, round, and reactive to light.  Cardiovascular:     Rate and Rhythm: Normal rate and regular rhythm.     Pulses: Normal pulses.     Heart sounds: Normal heart sounds.  Pulmonary:     Effort: Pulmonary effort is normal.     Breath sounds: Normal breath sounds.  Abdominal:     Palpations: Abdomen is soft.     Tenderness: There is no abdominal tenderness.  Musculoskeletal:     Cervical back: No tenderness.     Right lower leg: No edema.     Left lower leg: No edema.  Lymphadenopathy:     Cervical: No cervical adenopathy.  Skin:    General: Skin is warm and dry.     Capillary Refill: Capillary refill takes less than 2 seconds.  Neurological:     General: No focal deficit present.     Mental  Status: He is alert and oriented to person, place, and time.  Psychiatric:        Mood and Affect: Mood normal.        Behavior: Behavior normal.    DG Chest 2 View  Result Date: 11/22/2021 CLINICAL DATA:  COPD with productive cough EXAM: CHEST - 2 VIEW COMPARISON:  February 12, 2020 FINDINGS: The heart size and mediastinal contours are within normal limits. Both lungs are hyperinflated and remain clear except for a small linear scarring at the right  lung base, stable. The visualized skeletal structures are unremarkable. IMPRESSION: No active cardiopulmonary disease. Electronically Signed   By: Frazier Richards M.D.   On: 11/22/2021 14:51     ASSESSMENT & PLAN: A total of 46 minutes was spent with the patient and counseling/coordination of care regarding preparing for this visit, review of most recent office visit notes, diagnosis of COPD with acute exacerbation, need for antibiotic use, review of all medications and changes made, review of chest x-ray done today, prognosis, documentation, need for follow-up.  Problem List Items Addressed This Visit       Respiratory   Lower respiratory infection    Acute bronchitis.  Unlikely pneumonia. Recent change in color phlegm. Will benefit from azithromycin for 5 days.      Relevant Medications   azithromycin (ZITHROMAX) 250 MG tablet   Other Relevant Orders   DG Chest 2 View   Acute exacerbation of chronic obstructive pulmonary disease (COPD) ([place]) - Primary    Clinical stable.  Likely triggered by viral respiratory infection and now developing secondary bacterial infection.  Will benefit from azithromycin for 5 days.  Continue Symbicort and Spiriva. Albuterol as rescue inhaler. Start prednisone 40 mg daily for 5 days Chest x-ray done today Continue Mucinex Advised to stay well-hydrated and rest. Advised to contact the office if no better or worse during the next several days ED precautions given      Relevant Medications   azithromycin  (ZITHROMAX) 250 MG tablet   predniSONE (DELTASONE) 20 MG tablet   Other Relevant Orders   DG Chest 2 View     Other   Current smoker    Cancer and cardiovascular risks associated with smoking discussed.  Smoking cessation advice given.      Patient Instructions  Acute Bronchitis, Adult  Acute bronchitis is when air tubes in the lungs (bronchi) suddenly get swollen. The condition can make it hard for you to breathe. In adults, acute bronchitis usually goes away within 2 weeks. A cough caused by bronchitis may last up to 3 weeks. Smoking, allergies, and asthma can make the condition worse. What are the causes? Germs that cause cold and flu (viruses). The most common cause of this condition is the virus that causes the common cold. Bacteria. Substances that bother (irritate) the lungs, including: Smoke from cigarettes and other types of tobacco. Dust and pollen. Fumes from chemicals, gases, or burned fuel. Indoor or outdoor air pollution. What increases the risk? A weak body's defense system. This is also called the immune system. Any condition that affects your lungs and breathing, such as asthma. What are the signs or symptoms? A cough. Coughing up clear, yellow, or green mucus. Making high-pitched whistling sounds when you breathe, most often when you breathe out (wheezing). Runny or stuffy nose. Having too much mucus in your lungs (chest congestion). Shortness of breath. Body aches. A sore throat. How is this treated? Acute bronchitis may go away over time without treatment. Your doctor may tell you to: Drink more fluids. This will help thin your mucus so it is easier to cough up. Use a device that gets medicine into your lungs (inhaler). Use a vaporizer or a humidifier. These are machines that add water to the air. This helps with coughing and poor breathing. Take a medicine that thins mucus and helps clear it from your lungs. Take a medicine that prevents or stops  coughing. It is not common to take an antibiotic medicine for this condition. Follow these instructions  at home:  Take over-the-counter and prescription medicines only as told by your doctor. Use an inhaler, vaporizer, or humidifier as told by your doctor. Take two teaspoons (10 mL) of honey at bedtime. This helps lessen your coughing at night. Drink enough fluid to keep your pee (urine) pale yellow. Do not smoke or use any products that contain nicotine or tobacco. If you need help quitting, ask your doctor. Get a lot of rest. Return to your normal activities when your doctor says that it is safe. Keep all follow-up visits. How is this prevented?  Wash your hands often with soap and water for at least 20 seconds. If you cannot use soap and water, use hand sanitizer. Avoid contact with people who have cold symptoms. Try not to touch your mouth, nose, or eyes with your hands. Avoid breathing in smoke or chemical fumes. Make sure to get the flu shot every year. Contact a doctor if: Your symptoms do not get better in 2 weeks. You have trouble coughing up the mucus. Your cough keeps you awake at night. You have a fever. Get help right away if: You cough up blood. You have chest pain. You have very bad shortness of breath. You faint or keep feeling like you are going to faint. You have a very bad headache. Your fever or chills get worse. These symptoms may be an emergency. Get help right away. Call your local emergency services (911 in the [place] Do not wait to see if the symptoms will go away. Do not drive yourself to the hospital. Summary Acute bronchitis is when air tubes in the lungs (bronchi) suddenly get swollen. In adults, acute bronchitis usually goes away within 2 weeks. Drink more fluids. This will help thin your mucus so it is easier to cough up. Take over-the-counter and prescription medicines only as told by your doctor. Contact a doctor if your symptoms do not improve  after 2 weeks of treatment. This information is not intended to replace advice given to you by your health care provider. Make sure you discuss any questions you have with your health care provider. Document Revised: 08/24/2020 Document Reviewed: 08/24/2020 Elsevier Patient Education  [place], MD [facility]

## 2021-11-22 NOTE — Assessment & Plan Note (Signed)
Clinical stable.  Likely triggered by viral respiratory infection and now developing secondary bacterial infection.  Will benefit from azithromycin for 5 days.  Continue Symbicort and Spiriva. Albuterol as rescue inhaler. Start prednisone 40 mg daily for 5 days Chest x-ray done today Continue Mucinex Advised to stay well-hydrated and rest. Advised to contact the office if no better or worse during the next several days ED precautions given

## 2021-11-22 NOTE — Assessment & Plan Note (Signed)
Acute bronchitis.  Unlikely pneumonia. Recent change in color phlegm. Will benefit from azithromycin for 5 days.

## 2021-11-22 NOTE — Assessment & Plan Note (Signed)
Cancer and cardiovascular risks associated with smoking discussed.  Smoking cessation advice given. 

## 2022-02-10 ENCOUNTER — Other Ambulatory Visit: Payer: Self-pay | Admitting: Pulmonary Disease

## 2022-03-12 ENCOUNTER — Other Ambulatory Visit: Payer: Self-pay | Admitting: Emergency Medicine

## 2022-03-12 DIAGNOSIS — I700 Atherosclerosis of aorta: Secondary | ICD-10-CM

## 2022-03-15 ENCOUNTER — Other Ambulatory Visit: Payer: Self-pay | Admitting: Emergency Medicine

## 2022-03-15 DIAGNOSIS — I700 Atherosclerosis of aorta: Secondary | ICD-10-CM

## 2022-05-04 ENCOUNTER — Other Ambulatory Visit: Payer: Self-pay | Admitting: Pulmonary Disease

## 2022-05-04 DIAGNOSIS — Z8719 Personal history of other diseases of the digestive system: Secondary | ICD-10-CM

## 2022-05-10 DIAGNOSIS — H2513 Age-related nuclear cataract, bilateral: Secondary | ICD-10-CM | POA: Diagnosis not present

## 2022-06-17 ENCOUNTER — Other Ambulatory Visit: Payer: Self-pay | Admitting: Pulmonary Disease

## 2022-07-11 ENCOUNTER — Ambulatory Visit: Payer: Medicare Other | Admitting: Pulmonary Disease

## 2022-07-11 ENCOUNTER — Encounter: Payer: Self-pay | Admitting: Pulmonary Disease

## 2022-07-11 VITALS — BP 116/56 | HR 86 | Temp 98.1°F | Ht 69.5 in | Wt 154.0 lb

## 2022-07-11 DIAGNOSIS — Z87891 Personal history of nicotine dependence: Secondary | ICD-10-CM

## 2022-07-11 DIAGNOSIS — J4489 Other specified chronic obstructive pulmonary disease: Secondary | ICD-10-CM

## 2022-07-11 DIAGNOSIS — F17210 Nicotine dependence, cigarettes, uncomplicated: Secondary | ICD-10-CM | POA: Diagnosis not present

## 2022-07-11 DIAGNOSIS — J439 Emphysema, unspecified: Secondary | ICD-10-CM | POA: Diagnosis not present

## 2022-07-11 MED ORDER — NICOTINE 21 MG/24HR TD PT24
21.0000 mg | MEDICATED_PATCH | Freq: Every day | TRANSDERMAL | 0 refills | Status: DC
Start: 2022-07-11 — End: 2023-01-28

## 2022-07-11 NOTE — Patient Instructions (Signed)
I am glad you are stable with your breathing Continue to work on smoking cessation.  Will order nicotine patches Follow-up in 6 months

## 2022-07-11 NOTE — Progress Notes (Addendum)
Brandon Mckinney    [id number]    12-Dec-1955  Primary Care Physician:Sagardia, Ines Bloomer, MD  Referring Physician: Horald Pollen, [place],  [place] [id number]  Chief complaint: Follow-up for COPD.  HPI: 67 year old with COPD, GERD Self-referred for management of COPD  Diagnosed with COPD around 40 in [place].  Maintained on Symbicort, Spiriva, Ventolin rescue medication Complains of chronic dyspnea on exertion, cough with white mucus.  He recently moved from [place] and is here to establish care.  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  No mold, hot tub, Jacuzzi.  No feather pillows or comforters. Smoking history: 23-pack-year smoker.  Continues to smoke half pack per day Travel history: Grew up in [place].  Moved to [facility] around July 2021 to be closer to family after retirement Relevant family history: No significant family history of lung disease  Interim history: Symptoms are stable on Spiriva and Symbicort.  Continues to smoke.  He had a screening CT last year which showed a thyroid nodule and underwent fine-needle aspiration with no evidence of malignancy.  This is being followed by his primary care  07/11/2022: Since last visit, no hospitalizations and no COPD exacerbations. He reports that he is never using rescue inhaler. He is using his maintenance inhalers daily as prescribed. No respiratory infections recently. Patient is still smoking. He states that a pack is lasting him 3-4 days. He states that when he did quit smoking, he started to gain weight, and started having more breathing issues and that is why he kept smoking. He does state that he would like to try nicotine patches.   Outpatient Encounter Medications as of 07/11/2022  Medication Sig   albuterol (VENTOLIN HFA) 108 (90 Base) MCG/ACT inhaler Inhale 1 puff into the lungs every 6 (six) hours as needed for wheezing or  shortness of breath.   budesonide-formoterol (SYMBICORT) 160-4.5 MCG/ACT inhaler TAKE 1 PUFF BY MOUTH TWICE A DAY   omeprazole (PRILOSEC) 40 MG capsule TAKE 1 CAPSULE (40 MG TOTAL) BY MOUTH DAILY.   Tiotropium Bromide Monohydrate (SPIRIVA RESPIMAT) 2.5 MCG/ACT AERS INHALE 2 PUFFS BY MOUTH INTO THE LUNGS DAILY   [DISCONTINUED] azithromycin (ZITHROMAX) 250 MG tablet Sig as indicated   [DISCONTINUED] buPROPion (WELLBUTRIN XL) 150 MG 24 hr tablet TAKE 1 TABLET TWICE DAILY   [DISCONTINUED] rosuvastatin (CRESTOR) 10 MG tablet Take 10 mg by mouth at bedtime. (Patient not taking: Reported on 07/11/2022)   No facility-administered encounter medications on file as of 07/11/2022.   Physical Exam: Blood pressure 128/64, pulse 87, temperature 98.4 F (36.9 C), temperature source Oral, height 5' 9.5" (1.765 m), weight 160 lb 6.4 oz (72.8 kg), SpO2 97 %. Gen:      No acute distress HEENT:  EOMI, sclera anicteric Lungs:    Clear to auscultation bilaterally; normal respiratory effort CV:         Regular rate and rhythm; no murmurs Skin:      Warm and dry; no rash Psych: normal mood and affect   Data Reviewed: Imaging: Chest x-ray 12/13/2019-hyperinflation with no acute abnormality.  Screening CT chest 05/18/2020-left thyroid nodule, emphysema.  No pulmonary nodules I have reviewed the images personally.  CT chest lung cancer screening 07/13/2021: Lung RADS 1S, negative.  Mild diffuse bronchial wall thickening with moderate to severe centrilobular and paraseptal emphysema suggesting underlying COPD.  Follow-up low-dose CT without contrast in 12 months.  PFTs:  04/18/2020 FVC 3.41 [88%], FEV1 1.85 [63%], F/F 54, TLC 7.05 [101%], DLCO 13.46 [51%] Moderate obstruction, diffusion defect  Labs: CBC 02/12/2020-WBC 8.9, eos 1.2%, absolute eosinophil count 178 IgE 02/12/2020-14 Alpha-1 antitrypsin 02/12/2020-191, PI MM  Assessment:  COPD Patient is well-controlled on Symbicort and Spiriva.  Patient has had no  exacerbations in the last year.  Patient denies any hospitalizations or any upper respiratory infection the last year.  He has not had to use his albuterol at all.  He reports compliance with his medications.  On exam today, I do not appreciate any wheezing.  New imaging consistent with COPD.  No new PFTs on file but previous PFTs consistent with moderate obstruction and diffusion defect.  Continue current plan with Symbicort, Spiriva, and albuterol.  Active smoker Patient is an active smoker.  He has been smoking for over 23 years.  1 pack of cigarettes lasts about 3 to 4 days.  He has not cut down. Smoking cessation discussed.  He states that when he did quit previously, gained weight, which made his breathing worse, which he started smoking again.  Patient has not tolerated Chantix in the past due to hallucinations.  Patient is agreeable to nicotine patches today, and agreeable to try and quit smoking.   Low-dose screening CT lung cancer screening scheduled for 07/16/2022  Plan/Recommendations: Continue current inhalers Smoking cessation Low-dose screening CT on 07/16/22   Leigh Aurora, DO Internal Medicine Resident PGY1 [facility] 07/11/2022, 11:32 AM  CC: [place]   Attending note: I have seen and examined the patient. History, labs and imaging reviewed. Agree with assessment and plan as recorded by Dr. Venita Sheffield MD [facility] 07/11/2022, 1:37 PM

## 2022-07-11 NOTE — Progress Notes (Signed)
Brandon Mckinney    [id number]    1955/07/04  Primary Care Physician:Sagardia, Ines Bloomer, MD  Referring Physician: Horald Pollen, [place],  [place] [address]  Chief complaint: Follow-up for COPD.  HPI: 67 year old with COPD, GERD Self-referred for management of COPD  Diagnosed with COPD around 34 in [place].  Maintained on Symbicort, Spiriva, Ventolin rescue medication Complains of chronic dyspnea on exertion, cough with white mucus.  He recently moved from [place] and is here to establish care.  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  No mold, hot tub, Jacuzzi.  No feather pillows or comforters. Smoking history: 23-pack-year smoker.  Continues to smoke half pack per day Travel history: Grew up in [place].  Moved to [facility] around July 2021 to be closer to family after retirement Relevant family history: No significant family history of lung disease  Interim history: Symptoms are stable on Spiriva and Symbicort.  Continues to smoke.  He had a screening CT last year which showed a thyroid nodule and underwent fine-needle aspiration with no evidence of malignancy.  This is being followed by his primary care  07/11/2022: Since last visit, no hospitalizations and no COPD exacerbations. He reports that he is never using rescue inhaler. He is using his maintenance inhalers daily as prescribed. No respiratory infections recently. Patient is still smoking. He states that a pack is lasting him 3-4 days. He states that when he did quit smoking, he started to gain weight, and started having more breathing issues and that is why he kept smoking. He does state that he would like to try nicotine patches.   Outpatient Encounter Medications as of 07/11/2022  Medication Sig   albuterol (VENTOLIN HFA) 108 (90 Base) MCG/ACT inhaler Inhale 1 puff into the lungs every 6 (six) hours as needed for wheezing or  shortness of breath.   budesonide-formoterol (SYMBICORT) 160-4.5 MCG/ACT inhaler TAKE 1 PUFF BY MOUTH TWICE A DAY   nicotine (NICODERM CQ - DOSED IN MG/24 HOURS) 21 mg/24hr patch Place 1 patch (21 mg total) onto the skin daily.   omeprazole (PRILOSEC) 40 MG capsule TAKE 1 CAPSULE (40 MG TOTAL) BY MOUTH DAILY.   Tiotropium Bromide Monohydrate (SPIRIVA RESPIMAT) 2.5 MCG/ACT AERS INHALE 2 PUFFS BY MOUTH INTO THE LUNGS DAILY   [DISCONTINUED] azithromycin (ZITHROMAX) 250 MG tablet Sig as indicated   [DISCONTINUED] buPROPion (WELLBUTRIN XL) 150 MG 24 hr tablet TAKE 1 TABLET TWICE DAILY   [DISCONTINUED] rosuvastatin (CRESTOR) 10 MG tablet Take 10 mg by mouth at bedtime. (Patient not taking: Reported on 07/11/2022)   No facility-administered encounter medications on file as of 07/11/2022.   Physical Exam: Blood pressure 128/64, pulse 87, temperature 98.4 F (36.9 C), temperature source Oral, height 5' 9.5" (1.765 m), weight 160 lb 6.4 oz (72.8 kg), SpO2 97 %. Gen:      No acute distress HEENT:  EOMI, sclera anicteric Lungs:    Clear to auscultation bilaterally; normal respiratory effort CV:         Regular rate and rhythm; no murmurs Skin:      Warm and dry; no rash Psych: normal mood and affect   Data Reviewed: Imaging: Chest x-ray 12/13/2019-hyperinflation with no acute abnormality.  Screening CT chest 05/18/2020-left thyroid nodule, emphysema.  No pulmonary nodules I have reviewed the images personally.  CT chest lung cancer screening 07/13/2021: Lung RADS 1S, negative.  Mild diffuse bronchial wall  thickening with moderate to severe centrilobular and paraseptal emphysema suggesting underlying COPD.  Follow-up low-dose CT without contrast in 12 months.  PFTs: 04/18/2020 FVC 3.41 [88%], FEV1 1.85 [63%], F/F 54, TLC 7.05 [101%], DLCO 13.46 [51%] Moderate obstruction, diffusion defect  Labs: CBC 02/12/2020-WBC 8.9, eos 1.2%, absolute eosinophil count 178 IgE 02/12/2020-14 Alpha-1 antitrypsin  02/12/2020-191, PI MM  Assessment:  COPD Patient is well-controlled on Symbicort and Spiriva.  Patient has had no exacerbations in the last year.  Patient denies any hospitalizations or any upper respiratory infection the last year.  He has not had to use his albuterol at all.  He reports compliance with his medications.  On exam today, I do not appreciate any wheezing.  New imaging consistent with COPD.  No new PFTs on file but previous PFTs consistent with moderate obstruction and diffusion defect.  Continue current plan with Symbicort, Spiriva, and albuterol.  Active smoker Patient is an active smoker.  He has been smoking for over 23 years.  1 pack of cigarettes lasts about 3 to 4 days.  He has not cut down. Smoking cessation discussed.  He states that when he did quit previously, gained weight, which made his breathing worse, which he started smoking again.  Patient has not tolerated Chantix in the past due to hallucinations.  Patient is agreeable to nicotine patches today, and agreeable to try and quit smoking.   Low-dose screening CT lung cancer screening scheduled for 07/16/2022  Plan/Recommendations: Continue current inhalers Smoking cessation Low-dose screening CT on 07/16/22   Leigh Aurora, DO Internal Medicine Resident PGY1 [facility] 07/11/2022, 1:38 PM  CC: Horald Pollen, *   Attending note: I have seen and examined the patient. History, labs and imaging reviewed. Agree with assessment and plan as recorded by Dr. Venita Sheffield MD [facility] 07/11/2022, 1:38 PM

## 2022-07-16 ENCOUNTER — Inpatient Hospital Stay: Admission: RE | Admit: 2022-07-16 | Payer: Medicare Other | Source: Ambulatory Visit

## 2022-09-04 ENCOUNTER — Telehealth: Payer: Self-pay

## 2022-09-04 NOTE — Telephone Encounter (Signed)
Contacted Brandon Mckinney to schedule their annual wellness visit. Appointment made for 09/17/22.  Agnes Lawrence, CMA (AAMA)  [facility] [phone]

## 2022-09-07 ENCOUNTER — Other Ambulatory Visit: Payer: Self-pay | Admitting: Pulmonary Disease

## 2022-09-07 DIAGNOSIS — Z8719 Personal history of other diseases of the digestive system: Secondary | ICD-10-CM

## 2022-09-15 ENCOUNTER — Other Ambulatory Visit: Payer: Self-pay | Admitting: Pulmonary Disease

## 2022-09-17 ENCOUNTER — Ambulatory Visit (INDEPENDENT_AMBULATORY_CARE_PROVIDER_SITE_OTHER): Payer: Medicare Other

## 2022-09-17 ENCOUNTER — Ambulatory Visit (INDEPENDENT_AMBULATORY_CARE_PROVIDER_SITE_OTHER): Payer: Medicare Other | Admitting: Emergency Medicine

## 2022-09-17 ENCOUNTER — Encounter: Payer: Self-pay | Admitting: Emergency Medicine

## 2022-09-17 VITALS — BP 140/60 | HR 75 | Temp 98.3°F | Ht 69.5 in | Wt 151.0 lb

## 2022-09-17 DIAGNOSIS — Z1211 Encounter for screening for malignant neoplasm of colon: Secondary | ICD-10-CM

## 2022-09-17 DIAGNOSIS — Z1159 Encounter for screening for other viral diseases: Secondary | ICD-10-CM

## 2022-09-17 DIAGNOSIS — J439 Emphysema, unspecified: Secondary | ICD-10-CM

## 2022-09-17 DIAGNOSIS — Z122 Encounter for screening for malignant neoplasm of respiratory organs: Secondary | ICD-10-CM

## 2022-09-17 DIAGNOSIS — Z0000 Encounter for general adult medical examination without abnormal findings: Secondary | ICD-10-CM

## 2022-09-17 DIAGNOSIS — Z23 Encounter for immunization: Secondary | ICD-10-CM | POA: Diagnosis not present

## 2022-09-17 DIAGNOSIS — I700 Atherosclerosis of aorta: Secondary | ICD-10-CM | POA: Diagnosis not present

## 2022-09-17 DIAGNOSIS — F17200 Nicotine dependence, unspecified, uncomplicated: Secondary | ICD-10-CM

## 2022-09-17 MED ORDER — ROSUVASTATIN CALCIUM 10 MG PO TABS: 10.0000 mg | ORAL_TABLET | Freq: Every day | ORAL | 3 refills | Status: DC

## 2022-09-17 NOTE — Patient Instructions (Addendum)
Brandon Mckinney , Thank you for taking time to come for your Medicare Wellness Visit. I appreciate your ongoing commitment to your health goals. Please review the following plan we discussed and let me know if I can assist you in the future.   These are the goals we discussed:  Goals      Quit smoking.        This is a list of the screening recommended for you and due dates:  Health Maintenance  Topic Date Due   Pneumonia Vaccine (1 of 2 - PCV) Never done   Hepatitis C Screening: USPSTF Recommendation to screen - Ages 69-79 yo.  Never done   DTaP/Tdap/Td vaccine (1 - Tdap) Never done   Colon Cancer Screening  Never done   Zoster (Shingles) Vaccine (1 of 2) Never done   COVID-19 Vaccine (2 - 2023-24 season) 01/05/2022   Screening for Lung Cancer  07/14/2022   Medicare Annual Wellness Visit  09/09/2022   Flu Shot  12/06/2022   HPV Vaccine  Aged Out    Advanced directives: No; Advance directive discussed with you today. Even though you declined this today please call our office should you change your mind and we can give you the proper paperwork for you to fill out.  Conditions/risks identified: Yes; Smoking  Next appointment: Follow up in one year for your annual wellness visit.   Preventive Care 69 Years and Older, Male  Preventive care refers to lifestyle choices and visits with your health care provider that can promote health and wellness. What does preventive care include? A yearly physical exam. This is also called an annual well check. Dental exams once or twice a year. Routine eye exams. Ask your health care provider how often you should have your eyes checked. Personal lifestyle choices, including: Daily care of your teeth and gums. Regular physical activity. Eating a healthy diet. Avoiding tobacco and drug use. Limiting alcohol use. Practicing safe sex. Taking low doses of aspirin every day. Taking vitamin and mineral supplements as recommended by your health care  provider. What happens during an annual well check? The services and screenings done by your health care provider during your annual well check will depend on your age, overall health, lifestyle risk factors, and family history of disease. Counseling  Your health care provider may ask you questions about your: Alcohol use. Tobacco use. Drug use. Emotional well-being. Home and relationship well-being. Sexual activity. Eating habits. History of falls. Memory and ability to understand (cognition). Work and work Astronomer. Screening  You may have the following tests or measurements: Height, weight, and BMI. Blood pressure. Lipid and cholesterol levels. These may be checked every 5 years, or more frequently if you are over 32 years old. Skin check. Lung cancer screening. You may have this screening every year starting at age 69 if you have a 30-pack-year history of smoking and currently smoke or have quit within the past 15 years. Fecal occult blood test (FOBT) of the stool. You may have this test every year starting at age 69. Flexible sigmoidoscopy or colonoscopy. You may have a sigmoidoscopy every 5 years or a colonoscopy every 10 years starting at age 42. Prostate cancer screening. Recommendations will vary depending on your family history and other risks. Hepatitis C blood test. Hepatitis B blood test. Sexually transmitted disease (STD) testing. Diabetes screening. This is done by checking your blood sugar (glucose) after you have not eaten for a while (fasting). You may have this done every 1-3  years. Abdominal aortic aneurysm (AAA) screening. You may need this if you are a current or former smoker. Osteoporosis. You may be screened starting at age 69 if you are at high risk. Talk with your health care provider about your test results, treatment options, and if necessary, the need for more tests. Vaccines  Your health care provider may recommend certain vaccines, such  as: Influenza vaccine. This is recommended every year. Tetanus, diphtheria, and acellular pertussis (Tdap, Td) vaccine. You may need a Td booster every 10 years. Zoster vaccine. You may need this after age 69. Pneumococcal 13-valent conjugate (PCV13) vaccine. One dose is recommended after age 69. Pneumococcal polysaccharide (PPSV23) vaccine. One dose is recommended after age 69. Talk to your health care provider about which screenings and vaccines you need and how often you need them. This information is not intended to replace advice given to you by your health care provider. Make sure you discuss any questions you have with your health care provider. Document Released: 05/20/2015 Document Revised: 01/11/2016 Document Reviewed: 02/22/2015 Elsevier Interactive Patient Education  2017 [place] Prevention in the Home Falls can cause injuries. They can happen to people of all ages. There are many things you can do to make your home safe and to help prevent falls. What can I do on the outside of my home? Regularly fix the edges of walkways and driveways and fix any cracks. Remove anything that might make you trip as you walk through a door, such as a raised step or threshold. Trim any bushes or trees on the path to your home. Use bright outdoor lighting. Clear any walking paths of anything that might make someone trip, such as rocks or tools. Regularly check to see if handrails are loose or broken. Make sure that both sides of any steps have handrails. Any raised decks and porches should have guardrails on the edges. Have any leaves, snow, or ice cleared regularly. Use sand or salt on walking paths during winter. Clean up any spills in your garage right away. This includes oil or grease spills. What can I do in the bathroom? Use night lights. Install grab bars by the toilet and in the tub and shower. Do not use towel bars as grab bars. Use non-skid mats or decals in the tub or  shower. If you need to sit down in the shower, use a plastic, non-slip stool. Keep the floor dry. Clean up any water that spills on the floor as soon as it happens. Remove soap buildup in the tub or shower regularly. Attach bath mats securely with double-sided non-slip rug tape. Do not have throw rugs and other things on the floor that can make you trip. What can I do in the bedroom? Use night lights. Make sure that you have a light by your bed that is easy to reach. Do not use any sheets or blankets that are too big for your bed. They should not hang down onto the floor. Have a firm chair that has side arms. You can use this for support while you get dressed. Do not have throw rugs and other things on the floor that can make you trip. What can I do in the kitchen? Clean up any spills right away. Avoid walking on wet floors. Keep items that you use a lot in easy-to-reach places. If you need to reach something above you, use a strong step stool that has a grab bar. Keep electrical cords out of the way. Do  not use floor polish or wax that makes floors slippery. If you must use wax, use non-skid floor wax. Do not have throw rugs and other things on the floor that can make you trip. What can I do with my stairs? Do not leave any items on the stairs. Make sure that there are handrails on both sides of the stairs and use them. Fix handrails that are broken or loose. Make sure that handrails are as long as the stairways. Check any carpeting to make sure that it is firmly attached to the stairs. Fix any carpet that is loose or worn. Avoid having throw rugs at the top or bottom of the stairs. If you do have throw rugs, attach them to the floor with carpet tape. Make sure that you have a light switch at the top of the stairs and the bottom of the stairs. If you do not have them, ask someone to add them for you. What else can I do to help prevent falls? Wear shoes that: Do not have high heels. Have  rubber bottoms. Are comfortable and fit you well. Are closed at the toe. Do not wear sandals. If you use a stepladder: Make sure that it is fully opened. Do not climb a closed stepladder. Make sure that both sides of the stepladder are locked into place. Ask someone to hold it for you, if possible. Clearly mark and make sure that you can see: Any grab bars or handrails. First and last steps. Where the edge of each step is. Use tools that help you move around (mobility aids) if they are needed. These include: Canes. Walkers. Scooters. Crutches. Turn on the lights when you go into a dark area. Replace any light bulbs as soon as they burn out. Set up your furniture so you have a clear path. Avoid moving your furniture around. If any of your floors are uneven, fix them. If there are any pets around you, be aware of where they are. Review your medicines with your doctor. Some medicines can make you feel dizzy. This can increase your chance of falling. Ask your doctor what other things that you can do to help prevent falls. This information is not intended to replace advice given to you by your health care provider. Make sure you discuss any questions you have with your health care provider. Document Released: 02/17/2009 Document Revised: 09/29/2015 Document Reviewed: 05/28/2014 Elsevier Interactive Patient Education  2017 [employer].

## 2022-09-17 NOTE — Patient Instructions (Signed)
Chronic Obstructive Pulmonary Disease  Chronic obstructive pulmonary disease (COPD) is a long-term (chronic) lung problem. When you have COPD, it is hard for air to get in and out of your lungs. Usually the condition gets worse over time, and your lungs will never return to normal. There are things you can do to keep yourself as healthy as possible. What are the causes? Smoking. This is the most common cause. Certain genes passed from parent to child (inherited). What increases the risk? Being exposed to secondhand smoke from cigarettes, pipes, or cigars. Being exposed to chemicals and other irritants, such as fumes and dust in the work environment. Having chronic lung conditions or infections. What are the signs or symptoms? Shortness of breath, especially during physical activity. A long-term cough with a large amount of thick mucus. Sometimes, the cough may not have any mucus (dry cough). Wheezing. Breathing quickly. Skin that looks gray or blue, especially in the fingers, toes, or lips. Feeling tired (fatigue). Weight loss. Chest tightness. Having infections often. Episodes when breathing symptoms become much worse (exacerbations). At the later stages of this disease, you may have swelling in the ankles, feet, or legs. How is this treated? Taking medicines. Quitting smoking, if you smoke. Rehabilitation. This includes steps to make your body work better. It may involve a team of specialists. Doing exercises. Making changes to your diet. Using oxygen. Lung surgery. Lung transplant. Comfort measures (palliative care). Follow these instructions at home: Medicines Take over-the-counter and prescription medicines only as told by your doctor. Talk to your doctor before taking any cough or allergy medicines. You may need to avoid medicines that cause your lungs to be dry. Lifestyle If you smoke, stop smoking. Smoking makes the problem worse. Do not smoke or use any products that  contain nicotine or tobacco. If you need help quitting, ask your doctor. Avoid being around things that make your breathing worse. This may include smoke, chemicals, and fumes. Stay active, but remember to rest as well. Learn and use tips on how to manage stress and control your breathing. Make sure you get enough sleep. Most adults need at least 7 hours of sleep every night. Eat healthy foods. Eat smaller meals more often. Rest before meals. Controlled breathing Learn and use tips on how to control your breathing as told by your doctor. Try: Breathing in (inhaling) through your nose for 1 second. Then, pucker your lips and breath out (exhale) through your lips for 2 seconds. Putting one hand on your belly (abdomen). Breathe in slowly through your nose for 1 second. Your hand on your belly should move out. Pucker your lips and breathe out slowly through your lips. Your hand on your belly should move in as you breathe out.  Controlled coughing Learn and use controlled coughing to clear mucus from your lungs. Follow these steps: Lean your head a little forward. Breathe in deeply. Try to hold your breath for 3 seconds. Keep your mouth slightly open while coughing 2 times. Spit any mucus out into a tissue. Rest and do the steps again 1 or 2 times as needed. General instructions Make sure you get all the shots (vaccines) that your doctor recommends. Ask your doctor about a flu shot and a pneumonia shot. Use oxygen therapy and pulmonary rehabilitation if told by your doctor. If you need home oxygen therapy, ask your doctor if you should buy a tool to measure your oxygen level (oximeter). Make a COPD action plan with your doctor. This helps you   to know what to do if you feel worse than usual. Manage any other conditions you have as told by your doctor. Avoid going outside when it is very hot, cold, or humid. Avoid people who have a sickness you can catch (contagious). Keep all follow-up  visits. Contact a doctor if: You cough up more mucus than usual. There is a change in the color or thickness of the mucus. It is harder to breathe than usual. Your breathing is faster than usual. You have trouble sleeping. You need to use your medicines more often than usual. You have trouble doing your normal activities such as getting dressed or walking around the house. Get help right away if: You have shortness of breath while resting. You have shortness of breath that stops you from: Being able to talk. Doing normal activities. Your chest hurts for longer than 5 minutes. Your skin color is more blue than usual. Your pulse oximeter shows that you have low oxygen for longer than 5 minutes. You have a fever. You feel too tired to breathe normally. These symptoms may represent a serious problem that is an emergency. Do not wait to see if the symptoms will go away. Get medical help right away. Call your local emergency services (911 in the [place] Do not drive yourself to the hospital. Summary Chronic obstructive pulmonary disease (COPD) is a long-term lung problem. The way your lungs work will never return to normal. Usually the condition gets worse over time. There are things you can do to keep yourself as healthy as possible. Take over-the-counter and prescription medicines only as told by your doctor. If you smoke, stop. Smoking makes the problem worse. This information is not intended to replace advice given to you by your health care provider. Make sure you discuss any questions you have with your health care provider. Document Revised: 03/01/2020 Document Reviewed: 03/01/2020 Elsevier Patient Education  2023 [employer]  

## 2022-09-17 NOTE — Assessment & Plan Note (Signed)
Cardiovascular and cancer risk associated with smoking discussed. Smoking cessation advice given. Not ready to quit.

## 2022-09-17 NOTE — Assessment & Plan Note (Signed)
Clinical stable.  No recent lower respiratory infections. No recent use of rescue albuterol inhaler Doing well on Symbicort 160-4 4.5 and Spiriva 2.5 mcg Continues to smoke however.

## 2022-09-17 NOTE — Progress Notes (Signed)
Brandon Mckinney 67 y.o.   Chief Complaint  Patient presents with   Medical Management of Chronic Issues    fu appt, no concerns     HISTORY OF PRESENT ILLNESS: This is a 67 y.o. male here for 36-month follow-up of chronic medical conditions History of COPD.  Still smoking. Lung cancer screening 07/24/2021.  Due this year. Overall doing well.  Has no complaints or medical concerns today  HPI   Prior to Admission medications   Medication Sig Start Date End Date Taking? Authorizing Provider  albuterol (VENTOLIN HFA) 108 (90 Base) MCG/ACT inhaler Inhale 1 puff into the lungs every 6 (six) hours as needed for wheezing or shortness of breath. 06/30/21  Yes Mannam, Praveen, MD  budesonide-formoterol (SYMBICORT) 160-4.5 MCG/ACT inhaler INHALE 1 PUFF BY MOUTH TWICE A DAY 09/16/22  Yes Mannam, Praveen, MD  omeprazole (PRILOSEC) 40 MG capsule TAKE 1 CAPSULE (40 MG TOTAL) BY MOUTH DAILY. 09/07/22  Yes Mannam, Praveen, MD  Tiotropium Bromide Monohydrate (SPIRIVA RESPIMAT) 2.5 MCG/ACT AERS INHALE 2 PUFFS BY MOUTH INTO THE LUNGS DAILY 09/16/22  Yes Mannam, Praveen, MD  nicotine (NICODERM CQ - DOSED IN MG/24 HOURS) 21 mg/24hr patch Place 1 patch (21 mg total) onto the skin daily. Patient not taking: Reported on 09/17/2022 07/11/22   Chilton Greathouse, MD    No Known Allergies  Patient Active Problem List   Diagnosis Date Noted   Pulmonary emphysema (HCC) 09/27/2020   Thyroid nodule 09/27/2020   Current smoker 09/27/2020   Atherosclerosis of aorta (HCC) 09/27/2020    Past Medical History:  Diagnosis Date   COPD (chronic obstructive pulmonary disease) (HCC)     Past Surgical History:  Procedure Laterality Date   PENECTOMY      Social History   Socioeconomic History   Marital status: Married    Spouse name: Not on file   Number of children: Not on file   Years of education: Not on file   Highest education level: Not on file  Occupational History   Not on file  Tobacco Use   Smoking  status: Every Day    Packs/day: 1.00    Years: 45.00    Additional pack years: 0.00    Total pack years: 45.00    Types: Cigarettes    Start date: 1976   Smokeless tobacco: Never   Tobacco comments:    Less then 1pk daily/07/11/22  Vaping Use   Vaping Use: Never used  Substance and Sexual Activity   Alcohol use: Never   Drug use: Never   Sexual activity: Yes  Other Topics Concern   Not on file  Social History Narrative   Not on file   Social Determinants of Health   Financial Resource Strain: Low Risk  (09/17/2022)   Overall Financial Resource Strain (CARDIA)    Difficulty of Paying Living Expenses: Not hard at all  Food Insecurity: No Food Insecurity (09/17/2022)   Hunger Vital Sign    Worried About Running Out of Food in the Last Year: Never true    Ran Out of Food in the Last Year: Never true  Transportation Needs: No Transportation Needs (09/17/2022)   PRAPARE - Administrator, Civil Service (Medical): No    Lack of Transportation (Non-Medical): No  Physical Activity: Inactive (09/17/2022)   Exercise Vital Sign    Days of Exercise per Week: 0 days    Minutes of Exercise per Session: 0 min  Stress: No Stress Concern Present (09/17/2022)  [employer] of Occupational Health - Occupational Stress Questionnaire    Feeling of Stress : Not at all  Social Connections: Moderately Isolated (09/17/2022)   Social Connection and Isolation Panel [NHANES]    Frequency of Communication with Friends and Family: Once a week    Frequency of Social Gatherings with Friends and Family: Once a week    Attends Religious Services: 1 to 4 times per year    Active Member of [employer] or Organizations: No    Attends Banker Meetings: Never    Marital Status: Married  Catering manager Violence: Not At Risk (09/17/2022)   Humiliation, Afraid, Rape, and Kick questionnaire    Fear of Current or Ex-Partner: No    Emotionally Abused: No    Physically Abused: No    Sexually  Abused: No    No family history on file.   Review of Systems  Constitutional: Negative.  Negative for chills and fever.  HENT: Negative.  Negative for congestion and sore throat.   Respiratory: Negative.  Negative for cough and shortness of breath.   Cardiovascular: Negative.  Negative for chest pain and palpitations.  Gastrointestinal:  Negative for abdominal pain, diarrhea, nausea and vomiting.  Genitourinary: Negative.  Negative for dysuria and hematuria.  Skin: Negative.  Negative for rash.  Neurological: Negative.  Negative for dizziness and headaches.  All other systems reviewed and are negative.   Vitals:   09/17/22 1014  BP: (!) 140/60  Pulse: 75  Temp: 98.3 F (36.8 C)  SpO2: 98%    Physical Exam Vitals reviewed.  Constitutional:      Appearance: Normal appearance.  HENT:     Head: Normocephalic.     Mouth/Throat:     Mouth: Mucous membranes are moist.     Pharynx: Oropharynx is clear.  Eyes:     Extraocular Movements: Extraocular movements intact.     Conjunctiva/sclera: Conjunctivae normal.     Pupils: Pupils are equal, round, and reactive to light.  Cardiovascular:     Rate and Rhythm: Normal rate and regular rhythm.     Pulses: Normal pulses.     Heart sounds: Normal heart sounds.  Pulmonary:     Effort: Pulmonary effort is normal.     Breath sounds: Normal breath sounds.  Abdominal:     Palpations: Abdomen is soft.     Tenderness: There is no abdominal tenderness.  Musculoskeletal:     Cervical back: No tenderness.  Lymphadenopathy:     Cervical: No cervical adenopathy.  Skin:    General: Skin is warm and dry.     Capillary Refill: Capillary refill takes less than 2 seconds.  Neurological:     General: No focal deficit present.     Mental Status: He is alert and oriented to person, place, and time.  Psychiatric:        Mood and Affect: Mood normal.        Behavior: Behavior normal.     ASSESSMENT & PLAN: A total of 46 minutes was spent  with the patient and counseling/coordination of care regarding preparing for this visit, review of most recent office visit notes, review of most recent pulmonary office visit notes, review of multiple chronic medical conditions under management, review of all medications, review of most recent blood work results, smoking cessation advised, cardiovascular risks associated with atherosclerosis, education on nutrition, review of health maintenance items, need for pneumonia vaccination, prognosis, documentation, and need for follow-up.  Problem List Items Addressed This Visit  Cardiovascular and Mediastinum   Atherosclerosis of aorta (HCC)    Diet and nutrition discussed Lipid profile done today Recommend to start rosuvastatin 10 mg daily       Relevant Medications   rosuvastatin (CRESTOR) 10 MG tablet   Other Relevant Orders   Comprehensive metabolic panel   Hemoglobin A1c   Lipid panel     Respiratory   Pulmonary emphysema (HCC) - Primary    Clinical stable.  No recent lower respiratory infections. No recent use of rescue albuterol inhaler Doing well on Symbicort 160-4 4.5 and Spiriva 2.5 mcg Continues to smoke however.      Relevant Orders   CBC with Differential/Platelet   Comprehensive metabolic panel     Other   Current smoker    Cardiovascular and cancer risk associated with smoking discussed. Smoking cessation advice given. Not ready to quit.      Other Visit Diagnoses     Need for vaccination       Relevant Orders   Pneumococcal conjugate vaccine 20-valent (Prevnar 20)   Screening for colon cancer       Relevant Orders   Cologuard   Need for hepatitis C screening test       Relevant Orders   Hepatitis C antibody      Patient Instructions  Chronic Obstructive Pulmonary Disease  Chronic obstructive pulmonary disease (COPD) is a long-term (chronic) lung problem. When you have COPD, it is hard for air to get in and out of your lungs. Usually the  condition gets worse over time, and your lungs will never return to normal. There are things you can do to keep yourself as healthy as possible. What are the causes? Smoking. This is the most common cause. Certain genes passed from parent to child (inherited). What increases the risk? Being exposed to secondhand smoke from cigarettes, pipes, or cigars. Being exposed to chemicals and other irritants, such as fumes and dust in the work environment. Having chronic lung conditions or infections. What are the signs or symptoms? Shortness of breath, especially during physical activity. A long-term cough with a large amount of thick mucus. Sometimes, the cough may not have any mucus (dry cough). Wheezing. Breathing quickly. Skin that looks gray or blue, especially in the fingers, toes, or lips. Feeling tired (fatigue). Weight loss. Chest tightness. Having infections often. Episodes when breathing symptoms become much worse (exacerbations). At the later stages of this disease, you may have swelling in the ankles, feet, or legs. How is this treated? Taking medicines. Quitting smoking, if you smoke. Rehabilitation. This includes steps to make your body work better. It may involve a team of specialists. Doing exercises. Making changes to your diet. Using oxygen. Lung surgery. Lung transplant. Comfort measures (palliative care). Follow these instructions at home: Medicines Take over-the-counter and prescription medicines only as told by your doctor. Talk to your doctor before taking any cough or allergy medicines. You may need to avoid medicines that cause your lungs to be dry. Lifestyle If you smoke, stop smoking. Smoking makes the problem worse. Do not smoke or use any products that contain nicotine or tobacco. If you need help quitting, ask your doctor. Avoid being around things that make your breathing worse. This may include smoke, chemicals, and fumes. Stay active, but remember to  rest as well. Learn and use tips on how to manage stress and control your breathing. Make sure you get enough sleep. Most adults need at least 7 hours of sleep every night. Eat  healthy foods. Eat smaller meals more often. Rest before meals. Controlled breathing Learn and use tips on how to control your breathing as told by your doctor. Try: Breathing in (inhaling) through your nose for 1 second. Then, pucker your lips and breath out (exhale) through your lips for 2 seconds. Putting one hand on your belly (abdomen). Breathe in slowly through your nose for 1 second. Your hand on your belly should move out. Pucker your lips and breathe out slowly through your lips. Your hand on your belly should move in as you breathe out.  Controlled coughing Learn and use controlled coughing to clear mucus from your lungs. Follow these steps: Lean your head a little forward. Breathe in deeply. Try to hold your breath for 3 seconds. Keep your mouth slightly open while coughing 2 times. Spit any mucus out into a tissue. Rest and do the steps again 1 or 2 times as needed. General instructions Make sure you get all the shots (vaccines) that your doctor recommends. Ask your doctor about a flu shot and a pneumonia shot. Use oxygen therapy and pulmonary rehabilitation if told by your doctor. If you need home oxygen therapy, ask your doctor if you should buy a tool to measure your oxygen level (oximeter). Make a COPD action plan with your doctor. This helps you to know what to do if you feel worse than usual. Manage any other conditions you have as told by your doctor. Avoid going outside when it is very hot, cold, or humid. Avoid people who have a sickness you can catch (contagious). Keep all follow-up visits. Contact a doctor if: You cough up more mucus than usual. There is a change in the color or thickness of the mucus. It is harder to breathe than usual. Your breathing is faster than usual. You have trouble  sleeping. You need to use your medicines more often than usual. You have trouble doing your normal activities such as getting dressed or walking around the house. Get help right away if: You have shortness of breath while resting. You have shortness of breath that stops you from: Being able to talk. Doing normal activities. Your chest hurts for longer than 5 minutes. Your skin color is more blue than usual. Your pulse oximeter shows that you have low oxygen for longer than 5 minutes. You have a fever. You feel too tired to breathe normally. These symptoms may represent a serious problem that is an emergency. Do not wait to see if the symptoms will go away. Get medical help right away. Call your local emergency services (911 in the [place] Do not drive yourself to the hospital. Summary Chronic obstructive pulmonary disease (COPD) is a long-term lung problem. The way your lungs work will never return to normal. Usually the condition gets worse over time. There are things you can do to keep yourself as healthy as possible. Take over-the-counter and prescription medicines only as told by your doctor. If you smoke, stop. Smoking makes the problem worse. This information is not intended to replace advice given to you by your health care provider. Make sure you discuss any questions you have with your health care provider. Document Revised: 03/01/2020 Document Reviewed: 03/01/2020 Elsevier Patient Education  2023 [employer]     Edwina Barth, MD [facility]

## 2022-09-17 NOTE — Progress Notes (Signed)
Subjective:   Brandon Mckinney is a 67 y.o. male who presents for Medicare Annual/Subsequent preventive examination.  Review of Systems     Cardiac Risk Factors include: advanced age (>10men, >67 women)     Objective:    Today's Vitals   09/17/22 0953  BP: (!) 140/60  Pulse: 75  Temp: 98.3 F (36.8 C)  TempSrc: Temporal  SpO2: 98%  Weight: 151 lb (68.5 kg)  Height: 5' 9.5" (1.765 m)  PainSc: 0-No pain   Body mass index is 21.98 kg/m.     09/17/2022   10:01 AM 09/08/2021    9:49 AM  Advanced Directives  Does Patient Have a Medical Advance Directive? No No  Would patient like information on creating a medical advance directive? No - Patient declined No - Patient declined    Current Medications (verified) Outpatient Encounter Medications as of 09/17/2022  Medication Sig   albuterol (VENTOLIN HFA) 108 (90 Base) MCG/ACT inhaler Inhale 1 puff into the lungs every 6 (six) hours as needed for wheezing or shortness of breath.   budesonide-formoterol (SYMBICORT) 160-4.5 MCG/ACT inhaler INHALE 1 PUFF BY MOUTH TWICE A DAY   omeprazole (PRILOSEC) 40 MG capsule TAKE 1 CAPSULE (40 MG TOTAL) BY MOUTH DAILY.   Tiotropium Bromide Monohydrate (SPIRIVA RESPIMAT) 2.5 MCG/ACT AERS INHALE 2 PUFFS BY MOUTH INTO THE LUNGS DAILY   nicotine (NICODERM CQ - DOSED IN MG/24 HOURS) 21 mg/24hr patch Place 1 patch (21 mg total) onto the skin daily. (Patient not taking: Reported on 09/17/2022)   No facility-administered encounter medications on file as of 09/17/2022.    Allergies (verified) Patient has no known allergies.   History: Past Medical History:  Diagnosis Date   COPD (chronic obstructive pulmonary disease) (HCC)    Past Surgical History:  Procedure Laterality Date   PENECTOMY     History reviewed. No pertinent family history. Social History   Socioeconomic History   Marital status: Married    Spouse name: Not on file   Number of children: Not on file   Years of education: Not on file    Highest education level: Not on file  Occupational History   Not on file  Tobacco Use   Smoking status: Every Day    Packs/day: 1.00    Years: 45.00    Additional pack years: 0.00    Total pack years: 45.00    Types: Cigarettes    Start date: 1976   Smokeless tobacco: Never   Tobacco comments:    Less then 1pk daily/07/11/22  Vaping Use   Vaping Use: Never used  Substance and Sexual Activity   Alcohol use: Never   Drug use: Never   Sexual activity: Yes  Other Topics Concern   Not on file  Social History Narrative   Not on file   Social Determinants of Health   Financial Resource Strain: Low Risk  (09/17/2022)   Overall Financial Resource Strain (CARDIA)    Difficulty of Paying Living Expenses: Not hard at all  Food Insecurity: No Food Insecurity (09/17/2022)   Hunger Vital Sign    Worried About Running Out of Food in the Last Year: Never true    Ran Out of Food in the Last Year: Never true  Transportation Needs: No Transportation Needs (09/17/2022)   PRAPARE - Administrator, Civil Service (Medical): No    Lack of Transportation (Non-Medical): No  Physical Activity: Inactive (09/17/2022)   Exercise Vital Sign    Days of Exercise per  Week: 0 days    Minutes of Exercise per Session: 0 min  Stress: No Stress Concern Present (09/17/2022)   [employer] of Occupational Health - Occupational Stress Questionnaire    Feeling of Stress : Not at all  Social Connections: Moderately Isolated (09/17/2022)   Social Connection and Isolation Panel [NHANES]    Frequency of Communication with Friends and Family: Once a week    Frequency of Social Gatherings with Friends and Family: Once a week    Attends Religious Services: 1 to 4 times per year    Active Member of [employer] or Organizations: No    Attends Engineer, structural: Never    Marital Status: Married    Tobacco Counseling Ready to quit: Not Answered Counseling given: Not Answered Tobacco comments:  Less then 1pk daily/07/11/22   Clinical Intake:  Pre-visit preparation completed: Yes  Pain : No/denies pain Pain Score: 0-No pain     BMI - recorded: 21.98 Nutritional Status: BMI of 19-24  Normal Nutritional Risks: None Diabetes: No  How often do you need to have someone help you when you read instructions, pamphlets, or other written materials from your doctor or pharmacy?: 1 - Never What is the last grade level you completed in school?: HSG  Diabetic? No  Interpreter Needed?: No  Information entered by :: Susie Cassette, LPN.   Activities of Daily Living    09/17/2022   10:05 AM  In your present state of health, do you have any difficulty performing the following activities:  Hearing? 0  Vision? 0  Difficulty concentrating or making decisions? 0  Walking or climbing stairs? 0  Dressing or bathing? 0  Doing errands, shopping? 0  Preparing Food and eating ? N  Using the Toilet? N  In the past six months, have you accidently leaked urine? N  Do you have problems with loss of bowel control? N  Managing your Medications? N  Managing your Finances? N  Housekeeping or managing your Housekeeping? N    Patient Care Team: Georgina Quint, MD as PCP - General (Internal Medicine)  Indicate any recent Medical Services you may have received from other than [facility] providers in the past year (date may be approximate).     Assessment:   This is a routine wellness examination for [facility].  Hearing/Vision screen Hearing Screening - Comments:: Denies hearing difficulties   Vision Screening - Comments:: Wears rx glasses - up to date with routine eye exams with  Dr. Seth Bake  Dietary issues and exercise activities discussed: Current Exercise Habits: The patient does not participate in regular exercise at present, Exercise limited by: respiratory conditions(s)   Goals Addressed             This Visit's Progress    Quit smoking.        Depression Screen     09/17/2022   10:03 AM 11/22/2021    2:28 PM 09/08/2021    9:23 AM 09/27/2020    4:17 PM  PHQ 2/9 Scores  PHQ - 2 Score 0  0 0  PHQ- 9 Score 0     Exception Documentation  Patient refusal      Fall Risk    09/17/2022   10:02 AM 09/08/2021    9:49 AM 09/08/2021    9:23 AM 09/27/2020    4:17 PM  Fall Risk   Falls in the past year? 0 0 0 0  Number falls in past yr: 0   0  Injury  with Fall? 0   0  Risk for fall due to : No Fall Risks     Follow up Falls prevention discussed       FALL RISK PREVENTION PERTAINING TO THE HOME:  Any stairs in or around the home? No  If so, are there any without handrails? No  Home free of loose throw rugs in walkways, pet beds, electrical cords, etc? Yes  Adequate lighting in your home to reduce risk of falls? Yes   ASSISTIVE DEVICES UTILIZED TO PREVENT FALLS:  Life alert? No  Use of a cane, walker or w/c? No  Grab bars in the bathroom? Yes  Shower chair or bench in shower? Yes  Elevated toilet seat or a handicapped toilet? No   TIMED UP AND GO:  Was the test performed? Yes .  Length of time to ambulate 10 feet: 8 sec.   Gait steady and fast without use of assistive device  Cognitive Function:        09/17/2022   10:05 AM 09/08/2021    9:50 AM  6CIT Screen  What Year? 0 points 0 points  What month? 0 points 0 points  What time? 0 points 0 points  Count back from 20 0 points 0 points  Months in reverse 0 points 0 points  Repeat phrase 0 points 0 points  Total Score 0 points 0 points    Immunizations Immunization History  Administered Date(s) Administered   Fluad Quad(high Dose 65+) 04/18/2020   [place] Dibbles (J&J) SARS-COV-2 Vaccination 02/12/2020    TDAP status: Due, Education has been provided regarding the importance of this vaccine. Advised may receive this vaccine at local pharmacy or Health Dept. Aware to provide a copy of the vaccination record if obtained from local pharmacy or Health Dept. Verbalized acceptance and  understanding.  Flu Vaccine status: Due, Education has been provided regarding the importance of this vaccine. Advised may receive this vaccine at local pharmacy or Health Dept. Aware to provide a copy of the vaccination record if obtained from local pharmacy or Health Dept. Verbalized acceptance and understanding.  Pneumococcal vaccine status: Due, Education has been provided regarding the importance of this vaccine. Advised may receive this vaccine at local pharmacy or Health Dept. Aware to provide a copy of the vaccination record if obtained from local pharmacy or Health Dept. Verbalized acceptance and understanding.  Covid-19 vaccine status: Completed vaccines  Qualifies for Shingles Vaccine? Yes   Zostavax completed No   Shingrix Completed?: No.    Education has been provided regarding the importance of this vaccine. Patient has been advised to call insurance company to determine out of pocket expense if they have not yet received this vaccine. Advised may also receive vaccine at local pharmacy or Health Dept. Verbalized acceptance and understanding.  Screening Tests Health Maintenance  Topic Date Due   Pneumonia Vaccine 48+ Years old (1 of 2 - PCV) Never done   Hepatitis C Screening  Never done   DTaP/Tdap/Td (1 - Tdap) Never done   COLONOSCOPY (Pts 45-1yrs Insurance coverage will need to be confirmed)  Never done   Zoster Vaccines- Shingrix (1 of 2) Never done   COVID-19 Vaccine (2 - 2023-24 season) 01/05/2022   Lung Cancer Screening  07/14/2022   INFLUENZA VACCINE  12/06/2022   Medicare Annual Wellness (AWV)  09/17/2023   HPV VACCINES  Aged Out    Health Maintenance  Health Maintenance Due  Topic Date Due   Pneumonia Vaccine 18+ Years old (1 of  2 - PCV) Never done   Hepatitis C Screening  Never done   DTaP/Tdap/Td (1 - Tdap) Never done   COLONOSCOPY (Pts 45-51yrs Insurance coverage will need to be confirmed)  Never done   Zoster Vaccines- Shingrix (1 of 2) Never done    COVID-19 Vaccine (2 - 2023-24 season) 01/05/2022   Lung Cancer Screening  07/14/2022    Colorectal cancer screening: No record  Lung Cancer Screening: (Low Dose CT Chest recommended if Age 82-80 years, 30 pack-year currently smoking OR have quit w/in 15years.) does qualify.   Lung Cancer Screening Referral: ordered 09/17/2022  Additional Screening:  Hepatitis C Screening: does qualify; Completed: no  Vision Screening: Recommended annual ophthalmology exams for early detection of glaucoma and other disorders of the eye. Is the patient up to date with their annual eye exam?  Yes  Who is the provider or what is the name of the office in which the patient attends annual eye exams? Seth Bake, OD. If pt is not established with a provider, would they like to be referred to a provider to establish care? No .   Dental Screening: Recommended annual dental exams for proper oral hygiene  Community Resource Referral / Chronic Care Management: CRR required this visit?  No   CCM required this visit?  No      Plan:     I have personally reviewed and noted the following in the patient's chart:   Medical and social history Use of alcohol, tobacco or illicit drugs  Current medications and supplements including opioid prescriptions. Patient is not currently taking opioid prescriptions. Functional ability and status Nutritional status Physical activity Advanced directives List of other physicians Hospitalizations, surgeries, and ER visits in previous 12 months Vitals Screenings to include cognitive, depression, and falls Referrals and appointments  In addition, I have reviewed and discussed with patient certain preventive protocols, quality metrics, and best practice recommendations. A written personalized care plan for preventive services as well as general preventive health recommendations were provided to patient.     Mickeal Needy, LPN   0/[id number]   Nurse Notes:  Normal  cognitive status assessed by direct observation by this Nurse Health Advisor. No abnormalities found.

## 2022-09-17 NOTE — Assessment & Plan Note (Signed)
Diet and nutrition discussed Lipid profile done today Recommend to start rosuvastatin 10 mg daily

## 2022-09-19 ENCOUNTER — Telehealth: Payer: Self-pay | Admitting: Pulmonary Disease

## 2022-09-19 NOTE — Telephone Encounter (Signed)
PT wife calling stating meds not filled even when Pharm has sent over orders to [place] via fax. She states this is an ongoing issue and she has to call in to get any action taken month after month. She would ideally like a 90 day supply for all his meds.   [facility] [address]  Please call Wife to advise of action taken after 2:00   [phone]

## 2022-09-19 NOTE — Telephone Encounter (Signed)
ATC X1 unable to leave vm. Please advise medication was sent on 5/[facility] [address]. Please verify what other medications are needed.

## 2022-09-20 MED ORDER — SPIRIVA RESPIMAT 2.5 MCG/ACT IN AERS: INHALATION_SPRAY | RESPIRATORY_TRACT | 3 refills | Status: DC

## 2022-09-20 MED ORDER — BUDESONIDE-FORMOTEROL FUMARATE 160-4.5 MCG/ACT IN AERO: INHALATION_SPRAY | RESPIRATORY_TRACT | 3 refills | Status: DC

## 2022-09-20 MED ORDER — ALBUTEROL SULFATE HFA 108 (90 BASE) MCG/ACT IN AERS: 1.0000 | INHALATION_SPRAY | Freq: Four times a day (QID) | RESPIRATORY_TRACT | 3 refills | Status: DC | PRN

## 2022-09-20 NOTE — Telephone Encounter (Signed)
I called and spoke with Cala Bradford  She states that the pt is needing rx for 90 day supply symbicort, spiriva and albuterol  I have sent these all to the preferred pharm  Nothing further needed

## 2022-10-09 ENCOUNTER — Ambulatory Visit (HOSPITAL_COMMUNITY)
Admission: RE | Admit: 2022-10-09 | Discharge: 2022-10-09 | Disposition: A | Discharge status: Home / Self Care | Payer: Medicare Other | Source: Ambulatory Visit | Attending: Internal Medicine | Admitting: Internal Medicine

## 2022-10-09 ENCOUNTER — Encounter (HOSPITAL_COMMUNITY): Payer: Self-pay

## 2022-10-09 ENCOUNTER — Other Ambulatory Visit: Payer: Self-pay

## 2022-10-09 VITALS — BP 133/74 | HR 74 | Temp 98.4°F | Resp 18

## 2022-10-09 DIAGNOSIS — H179 Unspecified corneal scar and opacity: Secondary | ICD-10-CM | POA: Diagnosis not present

## 2022-10-09 DIAGNOSIS — B0052 Herpesviral keratitis: Secondary | ICD-10-CM

## 2022-10-09 DIAGNOSIS — H40051 Ocular hypertension, right eye: Secondary | ICD-10-CM | POA: Diagnosis not present

## 2022-10-09 DIAGNOSIS — B0059 Other herpesviral disease of eye: Secondary | ICD-10-CM | POA: Diagnosis not present

## 2022-10-09 DIAGNOSIS — H2513 Age-related nuclear cataract, bilateral: Secondary | ICD-10-CM | POA: Diagnosis not present

## 2022-10-09 MED ORDER — PREDNISOLONE ACETATE 1 % OP SUSP: 1.0000 [drp] | Freq: Four times a day (QID) | OPHTHALMIC | 0 refills | Status: DC

## 2022-10-09 MED ORDER — VALACYCLOVIR HCL 1 G PO TABS: 1000.0000 mg | ORAL_TABLET | Freq: Three times a day (TID) | ORAL | 0 refills | Status: DC

## 2022-10-09 NOTE — Discharge Instructions (Addendum)
Your eye doctor's appointment is today at 2:00 go immediately there after leaving urgent care.

## 2022-10-09 NOTE — ED Provider Notes (Signed)
[facility]    CSN: [id number] Arrival date & time: 10/09/22  1248      History   Chief Complaint Chief Complaint  Patient presents with   Eye Problem    HPI Brandon Mckinney is a 67 y.o. male.   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  Patient does not have an eye doctor here established in [place].  He endorses that he is having significant burning and itching today.  Attempted to use over-the-counter eyedrops to help with itching and this seemed to make symptoms worse.  Denies any increased pressure behind his eyes.  He denies any other URI symptoms. Past Medical History:  Diagnosis Date   COPD (chronic obstructive pulmonary disease) (HCC)     Patient Active Problem List   Diagnosis Date Noted   Pulmonary emphysema (HCC) 09/27/2020   Thyroid nodule 09/27/2020   Current smoker 09/27/2020   Atherosclerosis of aorta (HCC) 09/27/2020    Past Surgical History:  Procedure Laterality Date   PENECTOMY         Home Medications    Prior to Admission medications   Medication Sig Start Date End Date Taking? Authorizing Provider  albuterol (VENTOLIN HFA) 108 (90 Base) MCG/ACT inhaler Inhale 1 puff into the lungs every 6 (six) hours as needed for wheezing or shortness of breath. 09/20/22  Yes Mannam, Praveen, MD  budesonide-formoterol (SYMBICORT) 160-4.5 MCG/ACT inhaler INHALE 1 PUFF BY MOUTH TWICE A DAY 09/20/22  Yes Mannam, Praveen, MD  omeprazole (PRILOSEC) 40 MG capsule TAKE 1 CAPSULE (40 MG TOTAL) BY MOUTH DAILY. 09/07/22  Yes Mannam, Praveen, MD  Tiotropium Bromide Monohydrate (SPIRIVA RESPIMAT) 2.5 MCG/ACT AERS INHALE 2 PUFFS BY MOUTH INTO THE LUNGS DAILY 09/20/22  Yes Mannam, Praveen, MD  nicotine (NICODERM CQ - DOSED IN MG/24 HOURS) 21 mg/24hr patch Place 1 patch (21 mg total) onto the skin daily. Patient not taking:  Reported on 09/17/2022 07/11/22   Chilton Greathouse, MD  rosuvastatin (CRESTOR) 10 MG tablet Take 1 tablet (10 mg total) by mouth daily. 09/17/22   Georgina Quint, MD    Family History History reviewed. No pertinent family history.  Social History Social History   Tobacco Use   Smoking status: Every Day    Packs/day: 1.00    Years: 45.00    Additional pack years: 0.00    Total pack years: 45.00    Types: Cigarettes    Start date: 1976   Smokeless tobacco: Never   Tobacco comments:    Less then 1pk daily/07/11/22  Vaping Use   Vaping Use: Never used  Substance Use Topics   Alcohol use: Never   Drug use: Never     Allergies   Patient has no known allergies.   Review of Systems Review of Systems Pertinent negatives listed in HPI   Physical Exam Triage Vital Signs ED Triage Vitals  Enc Vitals Group     BP 10/09/22 1310 133/74     Pulse Rate 10/09/22 1310 74     Resp 10/09/22 1310 18     Temp 10/09/22 1310 98.4 F (36.9 C)     Temp src --      SpO2 10/09/22 1310 93 %     Weight --      Height --      Head Circumference --  Peak Flow --      Pain Score 10/09/22 1307 10     Pain Loc --      Pain Edu? --      Excl. in GC? --    No data found.  Updated Vital Signs BP 133/74   Pulse 74   Temp 98.4 F (36.9 C)   Resp 18   SpO2 93%   Visual Acuity Right Eye Distance: 20/70 Left Eye Distance: 20/50 Bilateral Distance: 20/40  Right Eye Near:   Left Eye Near:    Bilateral Near:     Physical Exam Vitals reviewed.  Constitutional:      Appearance: Normal appearance.  HENT:     Head: Normocephalic and atraumatic.  Eyes:     General:        Right eye: Discharge present.        Left eye: Discharge present.    Conjunctiva/sclera:     Right eye: Hemorrhage present.     Left eye: Hemorrhage present.     Comments: Fine blistery like lesions upper right eyelid.  No overt blistery lesions seen on sclera or pupil  Cardiovascular:     Rate and Rhythm:  Normal rate and regular rhythm.  Pulmonary:     Effort: Pulmonary effort is normal.     Breath sounds: Normal breath sounds.  Skin:    General: Skin is warm.  Neurological:     General: No focal deficit present.     Mental Status: He is alert.      UC Treatments / Results  Labs (all labs ordered are listed, but only abnormal results are displayed) Labs Reviewed - No data to display  EKG   Radiology No results found.  Procedures Procedures (including critical care time)  Medications Ordered in UC Medications - No data to display  Initial Impression / Assessment and Plan / UC Course  I have reviewed the triage vital signs and the nursing notes.  Pertinent labs & imaging results that were available during my care of the patient were reviewed by me and considered in my medical decision making (see chart for details).    Initially prescribed patient Valtrex and Pred forte however was able to secure an appointment for him to be evaluated immediately at [facility] ophthalmology.  Patient is going immediately to [facility] ophthalmology and is being discharged with no treatment instructions. Final Clinical Impressions(s) / UC Diagnoses   Final diagnoses:  Herpetic keratoconjunctivitis  Herpesviral blepharitis     Discharge Instructions      Your eye doctor's appointment is today at 2:00 go immediately there after leaving urgent care.     ED Prescriptions     Medication Sig Dispense Auth. Provider   valACYclovir (VALTREX) 1000 MG tablet  (Status: Discontinued) Take 1 tablet (1,000 mg total) by mouth 3 (three) times daily for 14 days. 42 tablet Bing Neighbors, NP   prednisoLONE acetate (PRED FORTE) 1 % ophthalmic suspension  (Status: Discontinued) Place 1 drop into the right eye 4 (four) times daily for 7 days. 1.4 mL Bing Neighbors, NP      PDMP not reviewed this encounter.   Bing Neighbors, NP 10/13/22 1242

## 2022-10-09 NOTE — ED Triage Notes (Signed)
Pt reports he gets blisters on his pupils called Herpes something . The blisters cause his vision to be blurred. [place] . Pt reports he used eye gtts.

## 2022-10-30 ENCOUNTER — Other Ambulatory Visit: Payer: Self-pay

## 2022-10-30 DIAGNOSIS — Z87891 Personal history of nicotine dependence: Secondary | ICD-10-CM

## 2022-10-30 DIAGNOSIS — F17210 Nicotine dependence, cigarettes, uncomplicated: Secondary | ICD-10-CM

## 2022-11-25 IMAGING — CT CT CHEST LUNG CANCER SCREENING LOW DOSE W/O CM
2 of 5 series · 15 of 40 positions shown, 18 images · non-contrast
Comparison: Low-dose lung cancer screening chest CT 05/18/2020.

CLINICAL DATA: 66-year-old male current smoker with 31 pack-year
history of smoking. Lung cancer screening examination.



[Series 4: lung 1.00 br44 cor · coronal · 0.73mm/px · 3 of 289 slices shown]
[im 58/289  lung]
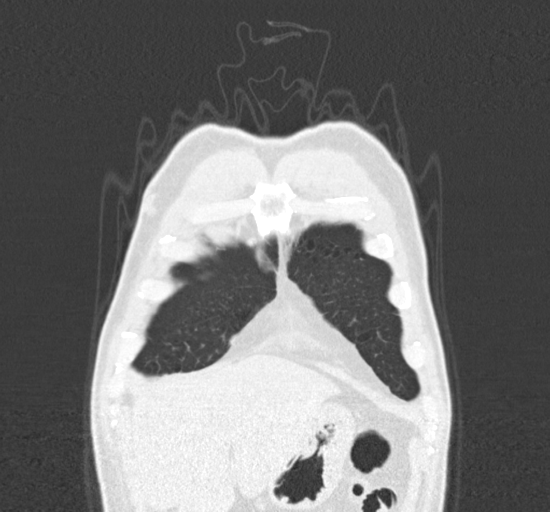
[im 116/289  lung]
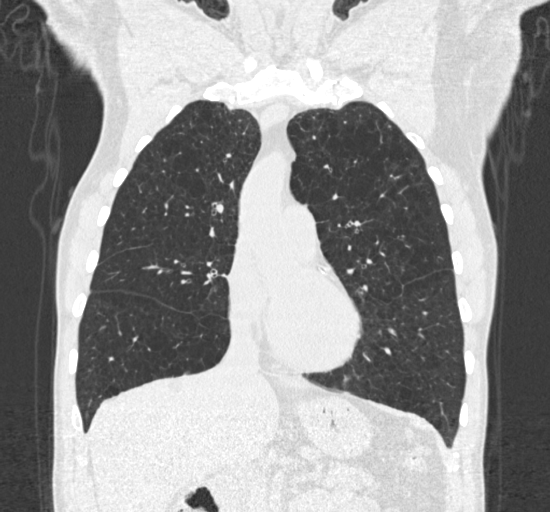
[im 173/289  lung]
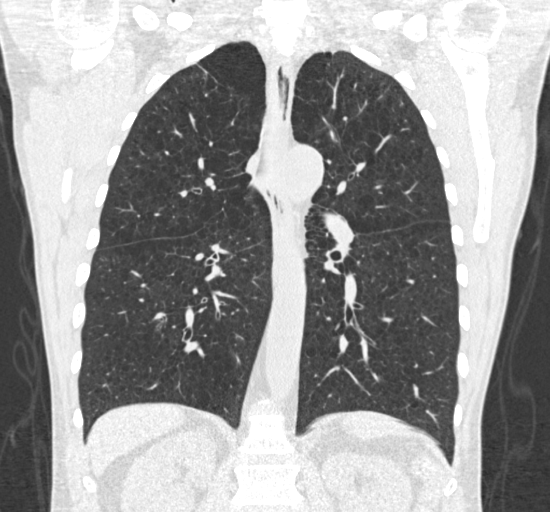

[Series 9: lung 1.00 br60 axial · axial · 0.75mm/px · z∈[-1179,-839]mm · 12 of 375 slices shown, 15 images]
[im 18/375  mediastinal]
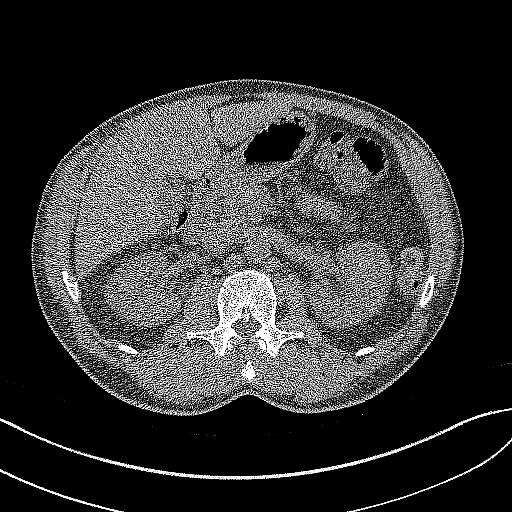
[im 18/375  lung]
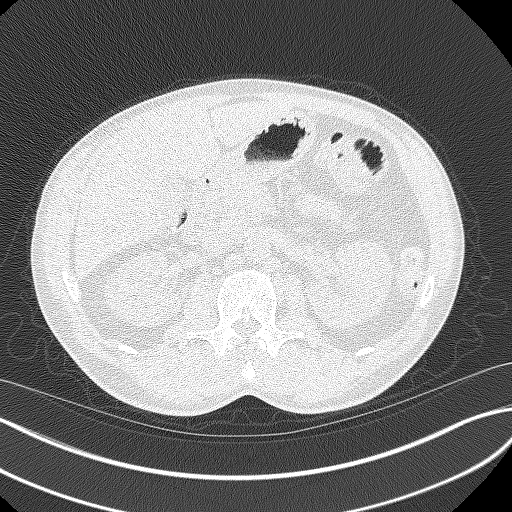
[im 52/375  lung]
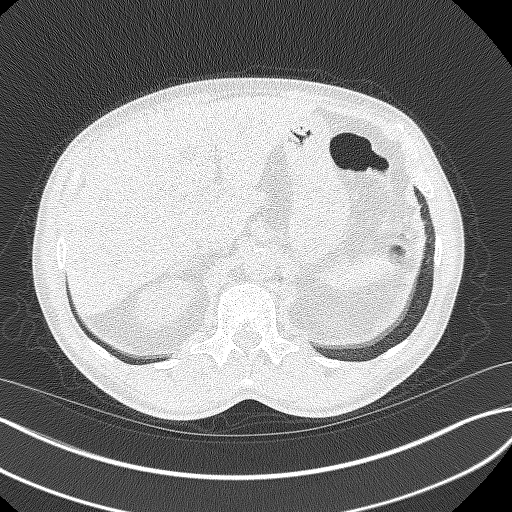
[im 86/375  lung]
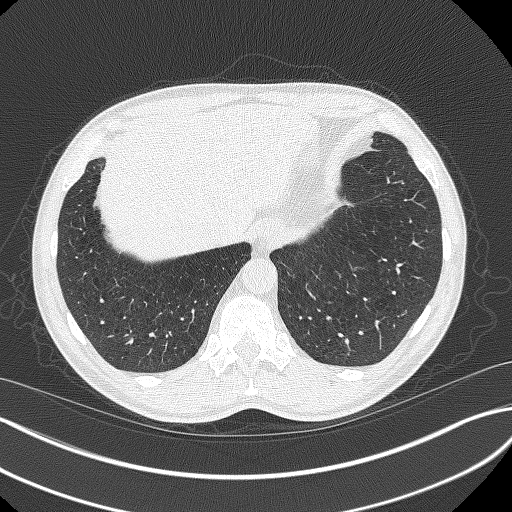
[im 120/375  lung]
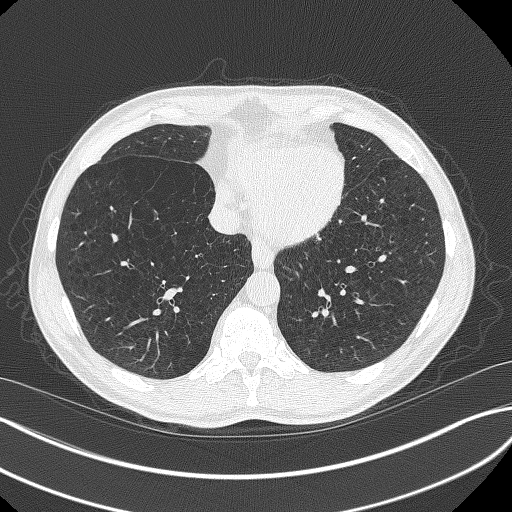
[im 137/375  mediastinal]
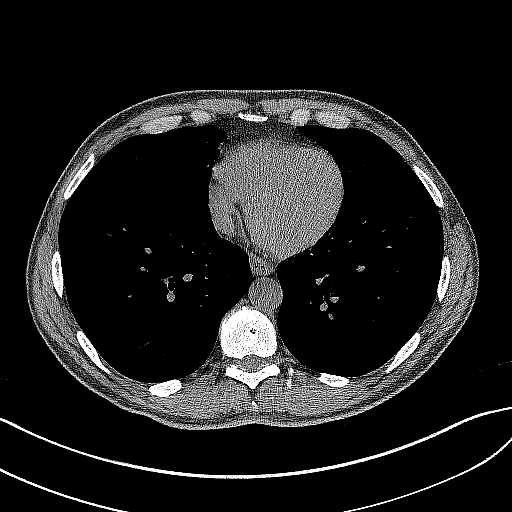
[im 137/375  lung]
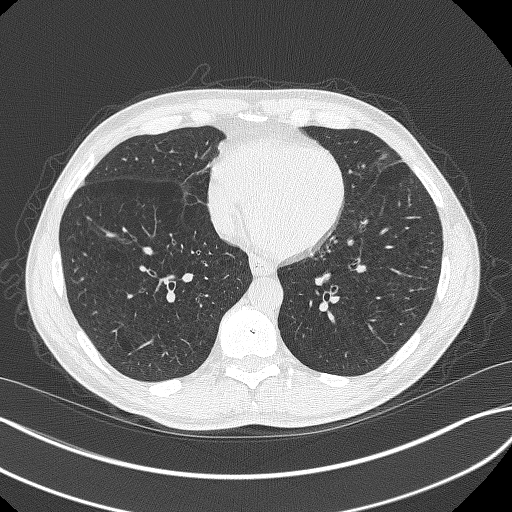
[im 171/375  lung]
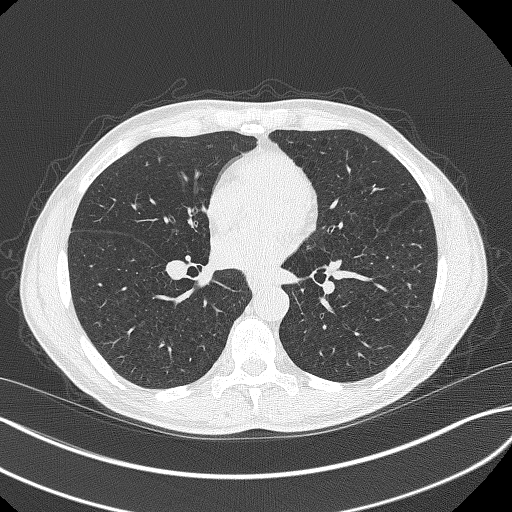
[im 205/375  lung]
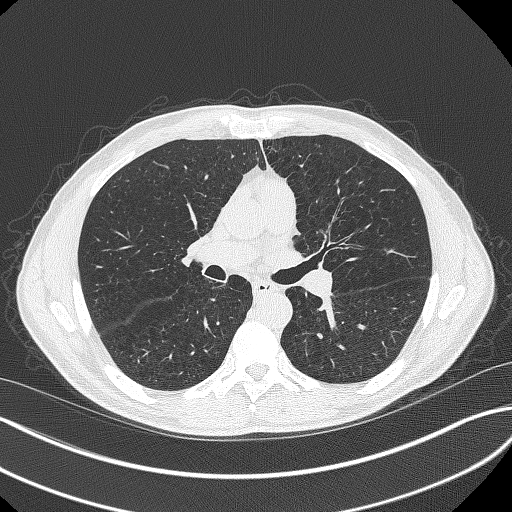
[im 239/375  lung]
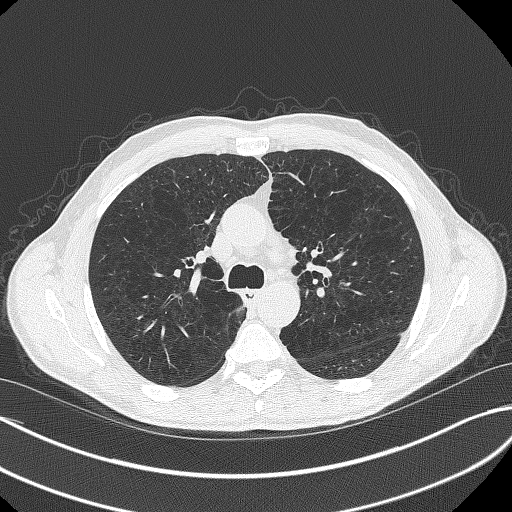
[im 256/375  mediastinal]
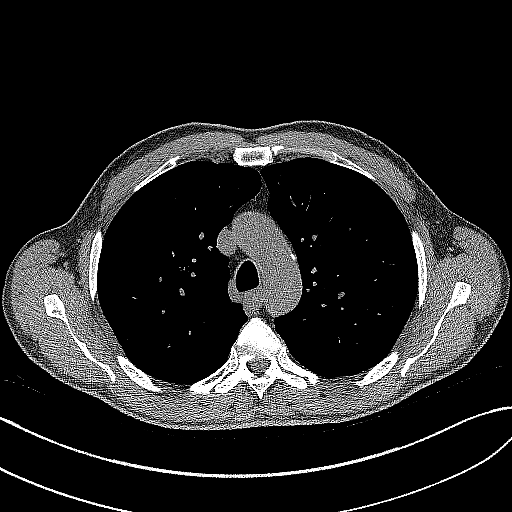
[im 256/375  lung]
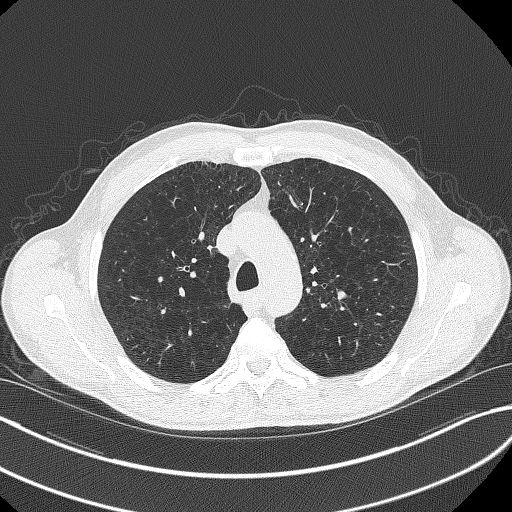
[im 290/375  lung]
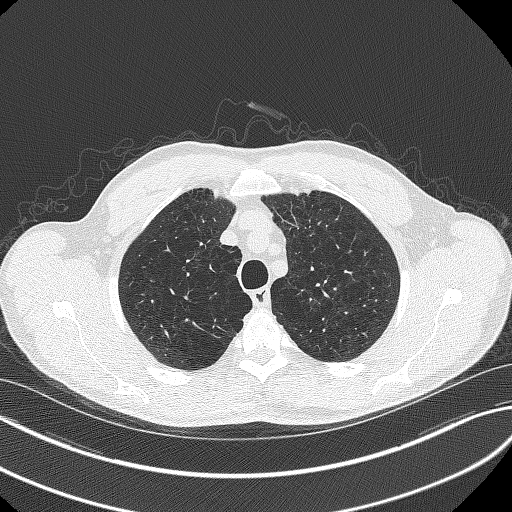
[im 324/375  lung]
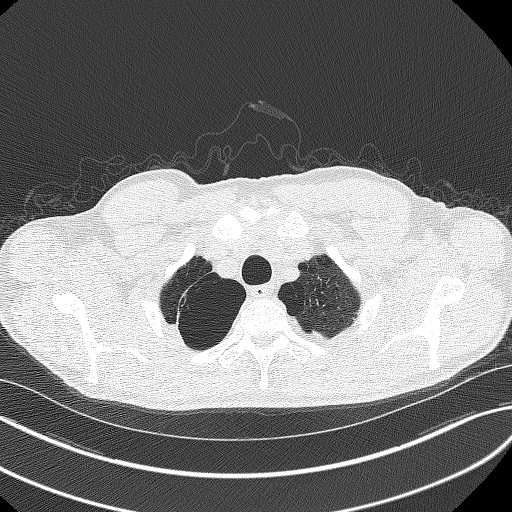
[im 358/375  lung]
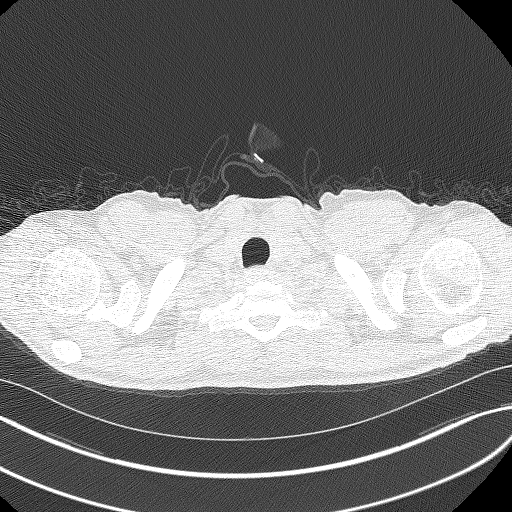

[15 of 40 positions shown; findings below may reference images not displayed]

FINDINGS: Cardiovascular: Heart size is normal. There is no significant
pericardial fluid, thickening or pericardial calcification. There is
aortic atherosclerosis, as well as atherosclerosis of the great
vessels of the mediastinum and the coronary arteries, including
calcified atherosclerotic plaque in the left main and left anterior
descending coronary arteries.

Mediastinum/Nodes: No pathologically enlarged mediastinal or hilar
lymph nodes. Please note that accurate exclusion of hilar adenopathy
is limited on noncontrast CT scans. Esophagus is unremarkable in
appearance. No axillary lymphadenopathy.

Lungs/Pleura: No suspicious appearing pulmonary nodules or masses
are noted. No acute consolidative airspace disease. No pleural
effusions. Mild diffuse bronchial wall thickening with moderate to
severe centrilobular and paraseptal emphysema.

Upper Abdomen: Aortic atherosclerosis. Low-attenuation lesions in
the liver, incompletely characterized on today's non-contrast CT
examination, but statistically likely to represent cysts measuring
up to 1.4 cm in segment 4A.

Musculoskeletal: There are no aggressive appearing lytic or blastic
lesions noted in the visualized portions of the skeleton.
IMPRESSION: 1. Lung-RADS 1S, negative. Continue annual screening with low-dose
chest CT without contrast in 12 months.
2. The "S" modifier above refers to potentially clinically
significant non lung cancer related findings. Specifically, there is
aortic atherosclerosis, in addition to left main and left anterior
descending coronary artery disease. Please note that although the
presence of coronary artery calcium documents the presence of
coronary artery disease, the severity of this disease and any
potential stenosis cannot be assessed on this non-gated CT
examination. Assessment for potential risk factor modification,
dietary therapy or pharmacologic therapy may be warranted, if
clinically indicated.
3. Mild diffuse bronchial wall thickening with moderate to severe
centrilobular and paraseptal emphysema; imaging findings suggestive
of underlying COPD.

Aortic Atherosclerosis (WG0K3-NEH.H) and Emphysema (WG0K3-652.3).

## 2023-01-02 ENCOUNTER — Ambulatory Visit
Admission: RE | Admit: 2023-01-02 | Discharge: 2023-01-02 | Disposition: A | Discharge status: Home / Self Care | Payer: Medicare Other | Source: Ambulatory Visit | Attending: Emergency Medicine | Admitting: Emergency Medicine

## 2023-01-02 DIAGNOSIS — F17210 Nicotine dependence, cigarettes, uncomplicated: Secondary | ICD-10-CM | POA: Diagnosis not present

## 2023-01-02 DIAGNOSIS — Z87891 Personal history of nicotine dependence: Secondary | ICD-10-CM

## 2023-01-11 ENCOUNTER — Telehealth: Payer: Self-pay | Admitting: Acute Care

## 2023-01-11 ENCOUNTER — Other Ambulatory Visit: Payer: Self-pay | Admitting: Acute Care

## 2023-01-11 DIAGNOSIS — R911 Solitary pulmonary nodule: Secondary | ICD-10-CM

## 2023-01-11 NOTE — Telephone Encounter (Signed)
I have called the patient with the results of his low-dose screening CT.  I explained that his scan was read as a lung RADS 4X, highly suspicious.  There is a 13.8 mm spiculated lingular nodule that is new and highly suspicious.  I explained that I will place an order for a PET scan to better evaluate this finding. He does live in [place] and the [facility] long scanner is convenient for him. He verbalized understanding of the above and has no further questions at completion of the phone call. He will need follow-up within a week with myself, Dr. Tonia Brooms, or Dr. Delton Coombes after the PET scan has been done to review results. Patient is in agreement with the plan. Sherre Lain, and [place], please fax results to PCP and let them know plan is for a PET scan and follow-up with pulmonary medicine. We will let them know results of the scan. Thanks so much

## 2023-01-11 NOTE — Telephone Encounter (Signed)
PET scan has been ordered by Kandice Robinsons, NP. LDCT results and plan has been sent to PCP. Will follow to see when PET has been scheduled to then schedule OV.

## 2023-01-16 NOTE — Telephone Encounter (Signed)
Patient has been scheduled to see Dr. Tonia Brooms on 9/25. Patient is aware.

## 2023-01-24 ENCOUNTER — Ambulatory Visit (HOSPITAL_COMMUNITY)
Admission: RE | Admit: 2023-01-24 | Discharge: 2023-01-24 | Disposition: A | Discharge status: Home / Self Care | Payer: Medicare Other | Source: Ambulatory Visit | Attending: Acute Care | Admitting: Acute Care

## 2023-01-24 DIAGNOSIS — R911 Solitary pulmonary nodule: Secondary | ICD-10-CM | POA: Insufficient documentation

## 2023-01-24 LAB — GLUCOSE, CAPILLARY: Glucose-Capillary: 82 mg/dL (ref 70–99)

## 2023-01-24 MED ORDER — FLUDEOXYGLUCOSE F - 18 (FDG) INJECTION
8.0000 | Freq: Once | INTRAVENOUS | Status: AC | PRN
  Administered 2023-01-24: 7.51 via INTRAVENOUS

## 2023-01-28 ENCOUNTER — Ambulatory Visit: Payer: Medicare Other | Admitting: Pulmonary Disease

## 2023-01-28 ENCOUNTER — Encounter: Payer: Self-pay | Admitting: Pulmonary Disease

## 2023-01-28 VITALS — BP 144/72 | HR 69 | Temp 97.9°F | Ht 69.5 in | Wt 147.6 lb

## 2023-01-28 DIAGNOSIS — Z23 Encounter for immunization: Secondary | ICD-10-CM

## 2023-01-28 DIAGNOSIS — F17210 Nicotine dependence, cigarettes, uncomplicated: Secondary | ICD-10-CM | POA: Diagnosis not present

## 2023-01-28 DIAGNOSIS — Z8719 Personal history of other diseases of the digestive system: Secondary | ICD-10-CM

## 2023-01-28 MED ORDER — TIOTROPIUM BROMIDE MONOHYDRATE 18 MCG IN CAPS: 18.0000 ug | ORAL_CAPSULE | Freq: Every day | RESPIRATORY_TRACT | 3 refills | Status: DC

## 2023-01-28 MED ORDER — ALBUTEROL SULFATE HFA 108 (90 BASE) MCG/ACT IN AERS: 1.0000 | INHALATION_SPRAY | Freq: Four times a day (QID) | RESPIRATORY_TRACT | 3 refills | Status: DC | PRN

## 2023-01-28 MED ORDER — NICOTINE 21 MG/24HR TD PT24: 21.0000 mg | MEDICATED_PATCH | Freq: Every day | TRANSDERMAL | 0 refills | Status: DC

## 2023-01-28 MED ORDER — BUDESONIDE-FORMOTEROL FUMARATE 160-4.5 MCG/ACT IN AERO: 2.0000 | INHALATION_SPRAY | Freq: Two times a day (BID) | RESPIRATORY_TRACT | 3 refills | Status: DC

## 2023-01-28 MED ORDER — VARENICLINE TARTRATE (STARTER) 0.5 MG X 11 & 1 MG X 42 PO TBPK: 1.0000 | ORAL_TABLET | Freq: Every day | ORAL | 0 refills | Status: DC

## 2023-01-28 MED ORDER — OMEPRAZOLE 40 MG PO CPDR: 40.0000 mg | DELAYED_RELEASE_CAPSULE | Freq: Every day | ORAL | 3 refills | Status: DC

## 2023-01-28 NOTE — Patient Instructions (Addendum)
Will renew your inhalers, Symbicort and Spiriva Work on smoking cessation Will also renew Prilosec for acid reflux You have an appointment on 9/25 with Dr. Tonia Brooms to discuss lung biopsy Return to clinic in 6 months

## 2023-01-28 NOTE — Progress Notes (Signed)
Brandon Mckinney    [id number]    Dec 27, 1955  Primary Care Physician:Sagardia, Eilleen Kempf, MD  Referring Physician: Georgina Quint, MD [address] [place],  [place] [address]  Chief complaint: Follow-up for COPD.  HPI: 67 year old with COPD, GERD Self-referred for management of COPD  Diagnosed with COPD around 57 in [place].  Maintained on Symbicort, Spiriva, Ventolin rescue medication Complains of chronic dyspnea on exertion, cough with white mucus.  He recently moved from [place] and is here to establish care. He had a screening CT last year which showed a thyroid nodule and underwent fine-needle aspiration with no evidence of malignancy.  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  No mold, hot tub, Jacuzzi.  No feather pillows or comforters. Smoking history: 23-pack-year smoker.  Continues to smoke half pack per day Travel history: Grew up in [place].  Moved to [facility] around July 2021 to be closer to family after retirement Relevant family history: No significant family history of lung disease  Interim history: Discussed the use of AI scribe software for clinical note transcription with the patient, who gave verbal consent to proceed.  The patient, with a history of COPD, presents with shortness of breath. He reports being out of his inhalers, Symbicort and Spiriva, and last used Symbicort several days ago. He used Garnette Scheuermann this morning as a substitute.  In addition, the patient recently had a PET scan due to abnormal findings on a CT scan, which revealed spots on his lungs. He is scheduled to see Dr.Icard in two days to discuss the next steps, which may include a lung biopsy.  The patient also has a history of smoking and has struggled with cessation. He has tried Chantix and nicotine patches in the past, with Chantix being more effective despite causing  occasional strange dreams. He expresses a willingness to retry Chantix in combination with nicotine patches.      Outpatient Encounter Medications as of 01/28/2023  Medication Sig   albuterol (VENTOLIN HFA) 108 (90 Base) MCG/ACT inhaler Inhale 1 puff into the lungs every 6 (six) hours as needed for wheezing or shortness of breath.   budesonide-formoterol (SYMBICORT) 160-4.5 MCG/ACT inhaler INHALE 1 PUFF BY MOUTH TWICE A DAY   nicotine (NICODERM CQ - DOSED IN MG/24 HOURS) 21 mg/24hr patch Place 1 patch (21 mg total) onto the skin daily.   omeprazole (PRILOSEC) 40 MG capsule TAKE 1 CAPSULE (40 MG TOTAL) BY MOUTH DAILY.   rosuvastatin (CRESTOR) 10 MG tablet Take 1 tablet (10 mg total) by mouth daily.   Tiotropium Bromide Monohydrate (SPIRIVA RESPIMAT) 2.5 MCG/ACT AERS INHALE 2 PUFFS BY MOUTH INTO THE LUNGS DAILY   No facility-administered encounter medications on file as of 01/28/2023.   Physical Exam: Blood pressure (!) 144/72, pulse 69, temperature 97.9 F (36.6 C), temperature source Oral, height 5' 9.5" (1.765 m), weight 147 lb 9.6 oz (67 kg), SpO2 100%. Gen:      No acute distress HEENT:  EOMI, sclera anicteric Neck:     No masses; no thyromegaly Lungs:    Clear to auscultation bilaterally; normal respiratory effort CV:         Regular rate and rhythm; no murmurs Abd:      + bowel sounds; soft, non-tender; no palpable masses, no distension Ext:    No edema; adequate peripheral perfusion Skin:  Warm and dry; no rash Neuro: alert and oriented x 3 Psych: normal mood and affect   Data Reviewed: Imaging: Chest x-ray 12/13/2019-hyperinflation with no acute abnormality.  Screening CT chest 05/18/2020-left thyroid nodule, emphysema.  No pulmonary nodules I have reviewed the images personally.  CT chest lung cancer screening 07/13/2021: Lung RADS 1S, negative.  Mild diffuse bronchial wall thickening with moderate to severe centrilobular and paraseptal emphysema suggesting underlying COPD.     CT chest lung cancer screening 01/02/2023-emphysema, 13.8 spiculated lingular nodule  PET scan 01/24/2023-final read is pending but the lingula nodule appears hypermetabolic.   PFTs 04/18/2020 FVC 3.41 [88%], FEV1 1.85 [63%], F/F 54, TLC 7.05 [101%], DLCO 13.46 [51%] Moderate obstruction, diffusion defect  Labs: CBC 02/12/2020-WBC 8.9, eos 1.2%, absolute eosinophil count 178 IgE 02/12/2020-14 Alpha-1 antitrypsin 02/12/2020-191, PI MM  Assessment:  Lung nodule There is a new finding on screening CT chest.  The PET scan final read is pending but it appears hypermetabolic on my review.  He has an appointment with Dr. Tonia Brooms on 9/25 to discuss lung biopsy.  COPD Patient is well-controlled on Symbicort and Spiriva.  Patient has had no exacerbations in the last year.   He is out of his medications and will need a refill.  Active smoker Patient is an active smoker.  He has been smoking for over 23 years.  1 pack of cigarettes lasts about 3 to 4 days.  He has not cut down. Smoking cessation discussed.  He wants to try to quit again.  Will prescribe nicotine patches and Chantix Time spent-5 minutes  GERD Continue Prilosec   Plan/Recommendations: Continue current inhalers Smoking cessation Follow-up on 9/25 to discuss lung biopsy PPI for GERD  Modena Slater, DO Internal Medicine Resident PGY1 [facility] 01/28/2023, 9:08 AM  CC: Georgina Quint, *   Attending note: I have seen and examined the patient. History, labs and imaging reviewed. Agree with assessment and plan as recorded by Dr. Ulyses Southward MD [facility] 01/28/2023, 9:08 AM

## 2023-01-28 NOTE — Addendum Note (Signed)
Addended by: Christen Butter on: 01/28/2023 10:57 AM   Modules accepted: Orders

## 2023-01-30 ENCOUNTER — Institutional Professional Consult (permissible substitution): Payer: Medicare Other | Admitting: Pulmonary Disease

## 2023-02-18 NOTE — Telephone Encounter (Signed)
Patient has been rescheduled to 10/23 with RB. Called and left VM for pt to see if he would like to come sooner, Dr. Tonia Brooms has 10/18 1030 opening.

## 2023-02-27 ENCOUNTER — Ambulatory Visit: Payer: Medicare Other | Admitting: Emergency Medicine

## 2023-02-27 ENCOUNTER — Encounter: Payer: Self-pay | Admitting: Emergency Medicine

## 2023-02-27 VITALS — BP 145/73 | HR 82 | Temp 97.7°F | Ht 69.5 in | Wt 151.6 lb

## 2023-02-27 DIAGNOSIS — R911 Solitary pulmonary nodule: Secondary | ICD-10-CM | POA: Diagnosis not present

## 2023-02-27 DIAGNOSIS — C3412 Malignant neoplasm of upper lobe, left bronchus or lung: Secondary | ICD-10-CM | POA: Insufficient documentation

## 2023-02-27 DIAGNOSIS — J439 Emphysema, unspecified: Secondary | ICD-10-CM | POA: Diagnosis not present

## 2023-02-27 DIAGNOSIS — F17200 Nicotine dependence, unspecified, uncomplicated: Secondary | ICD-10-CM | POA: Diagnosis not present

## 2023-02-27 DIAGNOSIS — K219 Gastro-esophageal reflux disease without esophagitis: Secondary | ICD-10-CM | POA: Diagnosis not present

## 2023-02-27 MED ORDER — SPIRIVA RESPIMAT 2.5 MCG/ACT IN AERS: 2.0000 | INHALATION_SPRAY | Freq: Every day | RESPIRATORY_TRACT | 6 refills | Status: AC

## 2023-02-27 NOTE — Progress Notes (Signed)
Subjective:    Patient ID: Brandon Mckinney, male    DOB: Jun 14, 1955, 67 y.o.   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 He established care with our office a month ago and was started on Chantix and nicotine patches in an effort to quit smoking. Despite these interventions, the patient continues to smoke.  The patient participates in the lung cancer screening program and his most recent scan showed bullous paracetal and central lobular emphysema and a suspicious 14mm lingular nodule, which was enlarged compared to the previous screening CT done in March 2023. The patient has no family history of lung cancer.  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 He has no personal history of cancer and has been proactive in getting annual check-ups due to his smoking history and family history of cancer.  He reported that the capsule form of Spiriva he was recently switched to does not seem to be effective. He has no significant breathing symptoms but does experience shortness of breath with activity, which he manages by pacing himself. He denies any significant cough, mucus production, or hemoptysis.  The patient's pulmonary function testing from 2021 showed moderate to moderately severe COPD. He is not on any blood thinning medication.   RADIOLOGY Chest CT: No mediastinal or hilar lymphadenopathy, bullous paraseptal and centrilobular emphysema, suspicious 14 mm lingular nodule (01/02/2023) Chest CT: Tiny nodule in the left lung (07/2021)  DIAGNOSTIC Pulmonary function testing: Moderate to moderately severe COPD (2021)   Review of Systems As per HPI  Past Medical History:  Diagnosis Date   COPD (chronic obstructive pulmonary disease) (HCC)      No family history on file.  - Father died of bone cancer -  Grandfather had colon cancer  Social History   Socioeconomic History   Marital status: Married    Spouse name: Not on file   Number of children: Not on file   Years of education: Not on file   Highest education level: Not on file  Occupational History   Not on file  Tobacco Use   Smoking status: Every Day    Current packs/day: 1.00    Average packs/day: 1 pack/day for 48.8 years (48.8 ttl pk-yrs)    Types: Cigarettes    Start date: 1976   Smokeless tobacco: Never   Tobacco comments:    Less then 1pk daily/07/11/22  Vaping Use   Vaping status: Never Used  Substance and Sexual Activity   Alcohol use: Never   Drug use: Never   Sexual activity: Yes  Other Topics Concern   Not on file  Social History Narrative   Not on file   Social Determinants of Health   Financial Resource Strain: Low Risk  (09/17/2022)   Overall Financial Resource Strain (CARDIA)    Difficulty of Paying Living Expenses: Not hard at all  Food Insecurity: No Food Insecurity (09/17/2022)   Hunger Vital Sign    Worried About Running Out of Food in the Last Year: Never true    Ran Out of Food in the Last Year: Never true  Transportation Needs: No Transportation Needs (09/17/2022)   PRAPARE - Administrator, Civil Service (Medical): No    Lack of Transportation (Non-Medical): No  Physical Activity: Inactive (09/17/2022)   Exercise Vital Sign  Days of Exercise per Week: 0 days    Minutes of Exercise per Session: 0 min  Stress: No Stress Concern Present (09/17/2022)   [employer] of Occupational Health - Occupational Stress Questionnaire    Feeling of Stress : Not at all  Social Connections: Moderately Isolated (09/17/2022)   Social Connection and Isolation Panel [NHANES]    Frequency of Communication with Friends and Family: Once a week    Frequency of Social Gatherings with Friends and Family: Once a week    Attends Religious Services: 1 to 4 times per year    Active Member of [employer] or  Organizations: No    Attends Banker Meetings: Never    Marital Status: Married  Catering manager Violence: Not At Risk (09/17/2022)   Humiliation, Afraid, Rape, and Kick questionnaire    Fear of Current or Ex-Partner: No    Emotionally Abused: No    Physically Abused: No    Sexually Abused: No    - 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 24 g 3   budesonide-formoterol (SYMBICORT) 160-4.5 MCG/ACT inhaler Inhale 2 puffs into the lungs in the morning and at bedtime. 3 each 3   nicotine (NICODERM CQ - DOSED IN MG/24 HOURS) 21 mg/24hr patch Place 1 patch (21 mg total) onto the skin daily. 28 patch 0   omeprazole (PRILOSEC) 40 MG capsule Take 1 capsule (40 mg total) by mouth daily. 90 capsule 3   rosuvastatin (CRESTOR) 10 MG tablet Take 1 tablet (10 mg total) by mouth daily. 90 tablet 3   Varenicline Tartrate, Starter, (CHANTIX STARTING MONTH PAK) 0.5 MG X 11 & 1 MG X 42 TBPK Take 1 tablet by mouth daily. 1 each 0   tiotropium (SPIRIVA) 18 MCG inhalation capsule Place 1 capsule (18 mcg total) into inhaler and inhale daily. 90 capsule 3   No facility-administered medications prior to visit.         Objective:   Physical Exam Vitals:   02/27/23 0942  BP: (!) 145/73  Pulse: 82  Temp: 97.7 F (36.5 C)  TempSrc: Oral  SpO2: 97%  Weight: 151 lb 9.6 oz (68.8 kg)  Height: 5' 9.5" (1.765 m)    Gen: Pleasant, well-nourished, in no distress,  normal affect  ENT: No lesions,  mouth clear,  oropharynx clear, no postnasal drip  Neck: No JVD, no stridor  Lungs: No use of accessory muscles, no crackles or wheezing on normal respiration, no wheeze on forced expiration  Cardiovascular: RRR, heart  sounds normal, no murmur or gallops, no peripheral edema  Musculoskeletal: No deformities, no cyanosis or clubbing  Neuro: alert, awake, non focal  Skin: Warm, no lesions or rash       Assessment & Plan:  Pulmonary nodule 1 cm or greater in diameter Suspicious Pulmonary Nodule New 14mm lingular nodule with suspicious characteristics on recent lung cancer screening CT, enlarged compared to previous CT in March 2023. Discussed the importance of early detection and intervention for potential lung cancer. -Schedule bronchoscopy for biopsies on March 11, 2023. Discussed risks including less than 2% chance of pneumothorax requiring hospitalization.   Current smoker Tobacco Use Active smoker with 49 pack years, currently on Chantix and nicotine  patches. Discussed the importance of smoking cessation. -Continue current smoking cessation efforts  Pulmonary emphysema (HCC) COPD Moderate to moderately severe COPD, currently on Spiriva and Symbicort. Patient reports shortness of breath with activity but is able to manage with pacing. No cough or mucus production. -Switch Spiriva from capsule form back to Respimat as per patient's preference.  GERD (gastroesophageal reflux disease) GERD Currently managed with PPI. No further issues discussed. -Continue current management.    Levy Pupa, MD, PhD 02/27/2023, 10:08 AM [facility] [phone] or if no answer before 7:00PM call [phone] For any issues after 7:00PM please call eLink [phone]

## 2023-02-27 NOTE — Assessment & Plan Note (Signed)
Tobacco Use Active smoker with 49 pack years, currently on Chantix and nicotine patches. Discussed the importance of smoking cessation. -Continue current smoking cessation efforts

## 2023-02-27 NOTE — Patient Instructions (Signed)
VISIT SUMMARY:  Today, we reviewed your recent lung cancer screening results and discussed your ongoing health concerns. We addressed the new findings on your CT scan, your COPD management, and your efforts to quit smoking. We also reviewed your GERD management and made some adjustments to your medications.  YOUR PLAN:  -SUSPICIOUS PULMONARY NODULE: A new 14mm nodule was found in your lung, which has grown since your last scan. This could potentially be an early sign of lung cancer, so we need to investigate further. We have scheduled a bronchoscopy for biopsies on March 11, 2023, to get more information. The procedure has a small risk of complications, including a less than 2% chance of a collapsed lung that might require hospitalization.  -COPD: Chronic Obstructive Pulmonary Disease (COPD) is a lung condition that makes it hard to breathe. You have moderate to moderately severe COPD. We will switch your Spiriva medication from the capsule form back to the Respimat inhaler, as you found it more effective. Continue using Symbicort as prescribed.  -GERD: Gastroesophageal Reflux Disease (GERD) is a condition where stomach acid frequently flows back into the tube connecting your mouth and stomach. Your GERD is currently well-managed with your current medication, so no changes are needed.  -TOBACCO USE: You have a long history of smoking, which significantly impacts your lung health. Despite using Chantix and nicotine patches, you continue to smoke. It is crucial to quit smoking to improve your overall health. Please continue your current smoking cessation efforts.  INSTRUCTIONS:  Please follow up with the bronchoscopy appointment on March 11, 2023. Continue taking your medications as prescribed and try to quit smoking. If you experience any new or worsening symptoms, contact our office immediately.

## 2023-02-27 NOTE — Assessment & Plan Note (Signed)
GERD Currently managed with PPI. No further issues discussed. -Continue current management.

## 2023-02-27 NOTE — Assessment & Plan Note (Signed)
Suspicious Pulmonary Nodule New 14mm lingular nodule with suspicious characteristics on recent lung cancer screening CT, enlarged compared to previous CT in March 2023. Discussed the importance of early detection and intervention for potential lung cancer. -Schedule bronchoscopy for biopsies on March 11, 2023. Discussed risks including less than 2% chance of pneumothorax requiring hospitalization.

## 2023-02-27 NOTE — Assessment & Plan Note (Signed)
COPD Moderate to moderately severe COPD, currently on Spiriva and Symbicort. Patient reports shortness of breath with activity but is able to manage with pacing. No cough or mucus production. -Switch Spiriva from capsule form back to Respimat as per patient's preference.

## 2023-02-27 NOTE — H&P (View-Only) (Signed)
Subjective:    Patient ID: Brandon Mckinney, male    DOB: Jun 14, 1955, 67 y.o.   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 He established care with our office a month ago and was started on Chantix and nicotine patches in an effort to quit smoking. Despite these interventions, the patient continues to smoke.  The patient participates in the lung cancer screening program and his most recent scan showed bullous paracetal and central lobular emphysema and a suspicious 14mm lingular nodule, which was enlarged compared to the previous screening CT done in March 2023. The patient has no family history of lung cancer.  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 He has no personal history of cancer and has been proactive in getting annual check-ups due to his smoking history and family history of cancer.  He reported that the capsule form of Spiriva he was recently switched to does not seem to be effective. He has no significant breathing symptoms but does experience shortness of breath with activity, which he manages by pacing himself. He denies any significant cough, mucus production, or hemoptysis.  The patient's pulmonary function testing from 2021 showed moderate to moderately severe COPD. He is not on any blood thinning medication.   RADIOLOGY Chest CT: No mediastinal or hilar lymphadenopathy, bullous paraseptal and centrilobular emphysema, suspicious 14 mm lingular nodule (01/02/2023) Chest CT: Tiny nodule in the left lung (07/2021)  DIAGNOSTIC Pulmonary function testing: Moderate to moderately severe COPD (2021)   Review of Systems As per HPI  Past Medical History:  Diagnosis Date   COPD (chronic obstructive pulmonary disease) (HCC)      No family history on file.  - Father died of bone cancer -  Grandfather had colon cancer  Social History   Socioeconomic History   Marital status: Married    Spouse name: Not on file   Number of children: Not on file   Years of education: Not on file   Highest education level: Not on file  Occupational History   Not on file  Tobacco Use   Smoking status: Every Day    Current packs/day: 1.00    Average packs/day: 1 pack/day for 48.8 years (48.8 ttl pk-yrs)    Types: Cigarettes    Start date: 1976   Smokeless tobacco: Never   Tobacco comments:    Less then 1pk daily/07/11/22  Vaping Use   Vaping status: Never Used  Substance and Sexual Activity   Alcohol use: Never   Drug use: Never   Sexual activity: Yes  Other Topics Concern   Not on file  Social History Narrative   Not on file   Social Determinants of Health   Financial Resource Strain: Low Risk  (09/17/2022)   Overall Financial Resource Strain (CARDIA)    Difficulty of Paying Living Expenses: Not hard at all  Food Insecurity: No Food Insecurity (09/17/2022)   Hunger Vital Sign    Worried About Running Out of Food in the Last Year: Never true    Ran Out of Food in the Last Year: Never true  Transportation Needs: No Transportation Needs (09/17/2022)   PRAPARE - Administrator, Civil Service (Medical): No    Lack of Transportation (Non-Medical): No  Physical Activity: Inactive (09/17/2022)   Exercise Vital Sign  Days of Exercise per Week: 0 days    Minutes of Exercise per Session: 0 min  Stress: No Stress Concern Present (09/17/2022)   [employer] of Occupational Health - Occupational Stress Questionnaire    Feeling of Stress : Not at all  Social Connections: Moderately Isolated (09/17/2022)   Social Connection and Isolation Panel [NHANES]    Frequency of Communication with Friends and Family: Once a week    Frequency of Social Gatherings with Friends and Family: Once a week    Attends Religious Services: 1 to 4 times per year    Active Member of [employer] or  Organizations: No    Attends Banker Meetings: Never    Marital Status: Married  Catering manager Violence: Not At Risk (09/17/2022)   Humiliation, Afraid, Rape, and Kick questionnaire    Fear of Current or Ex-Partner: No    Emotionally Abused: No    Physically Abused: No    Sexually Abused: No    - 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 24 g 3   budesonide-formoterol (SYMBICORT) 160-4.5 MCG/ACT inhaler Inhale 2 puffs into the lungs in the morning and at bedtime. 3 each 3   nicotine (NICODERM CQ - DOSED IN MG/24 HOURS) 21 mg/24hr patch Place 1 patch (21 mg total) onto the skin daily. 28 patch 0   omeprazole (PRILOSEC) 40 MG capsule Take 1 capsule (40 mg total) by mouth daily. 90 capsule 3   rosuvastatin (CRESTOR) 10 MG tablet Take 1 tablet (10 mg total) by mouth daily. 90 tablet 3   Varenicline Tartrate, Starter, (CHANTIX STARTING MONTH PAK) 0.5 MG X 11 & 1 MG X 42 TBPK Take 1 tablet by mouth daily. 1 each 0   tiotropium (SPIRIVA) 18 MCG inhalation capsule Place 1 capsule (18 mcg total) into inhaler and inhale daily. 90 capsule 3   No facility-administered medications prior to visit.         Objective:   Physical Exam Vitals:   02/27/23 0942  BP: (!) 145/73  Pulse: 82  Temp: 97.7 F (36.5 C)  TempSrc: Oral  SpO2: 97%  Weight: 151 lb 9.6 oz (68.8 kg)  Height: 5' 9.5" (1.765 m)    Gen: Pleasant, well-nourished, in no distress,  normal affect  ENT: No lesions,  mouth clear,  oropharynx clear, no postnasal drip  Neck: No JVD, no stridor  Lungs: No use of accessory muscles, no crackles or wheezing on normal respiration, no wheeze on forced expiration  Cardiovascular: RRR, heart  sounds normal, no murmur or gallops, no peripheral edema  Musculoskeletal: No deformities, no cyanosis or clubbing  Neuro: alert, awake, non focal  Skin: Warm, no lesions or rash       Assessment & Plan:  Pulmonary nodule 1 cm or greater in diameter Suspicious Pulmonary Nodule New 14mm lingular nodule with suspicious characteristics on recent lung cancer screening CT, enlarged compared to previous CT in March 2023. Discussed the importance of early detection and intervention for potential lung cancer. -Schedule bronchoscopy for biopsies on March 11, 2023. Discussed risks including less than 2% chance of pneumothorax requiring hospitalization.   Current smoker Tobacco Use Active smoker with 49 pack years, currently on Chantix and nicotine  patches. Discussed the importance of smoking cessation. -Continue current smoking cessation efforts  Pulmonary emphysema (HCC) COPD Moderate to moderately severe COPD, currently on Spiriva and Symbicort. Patient reports shortness of breath with activity but is able to manage with pacing. No cough or mucus production. -Switch Spiriva from capsule form back to Respimat as per patient's preference.  GERD (gastroesophageal reflux disease) GERD Currently managed with PPI. No further issues discussed. -Continue current management.    Levy Pupa, MD, PhD 02/27/2023, 10:08 AM [facility] [phone] or if no answer before 7:00PM call [phone] For any issues after 7:00PM please call eLink [phone]

## 2023-03-07 ENCOUNTER — Other Ambulatory Visit: Payer: Self-pay

## 2023-03-07 ENCOUNTER — Encounter (HOSPITAL_COMMUNITY): Payer: Self-pay | Admitting: Emergency Medicine

## 2023-03-07 NOTE — Progress Notes (Signed)
PCP - Georgina Quint,  Cardiologist - denies  PPM/ICD - denies Device Orders - n/a Rep Notified - n/a  Chest x-ray - chest CT 01-11-23 EKG - denies Stress Test - denies ECHO - denies Cardiac Cath - denies  CPAP - denies  DM- denies  Blood Thinner Instructions: denies Aspirin Instructions: n/a  ERAS Protcol - NPO per instructions from surgeon  COVID TEST- n/a  Anesthesia review: no  Patient verbally denies any shortness of breath, fever, cough and chest pain during phone call   -------------  SDW INSTRUCTIONS given:  Your procedure is scheduled on March 11, 2023.  Report to [facility] Main Entrance "A" at 8:30 A.M., and check in at the Admitting office.  Call this number if you have problems the morning of surgery:  [phone]   Remember:  Do not eat  or drink  after midnight the night before your surgery      Take these medicines the morning of surgery with A SIP OF WATER  Tiotropium Bromide Monohydrate  omeprazole (PRILOSEC)  budesonide-formoterol (SYMBICORT)  albuterol (VENTOLIN HFA)   As of today, STOP taking any Aspirin (unless otherwise instructed by your surgeon) Aleve, Naproxen, Ibuprofen, Motrin, Advil, Goody's, BC's, all herbal medications, fish oil, and all vitamins.                      Do not wear jewelry, make up, or nail polish            Do not wear lotions, powders, perfumes/colognes, or deodorant.            Do not shave 48 hours prior to surgery.  Men may shave face and neck.            Do not bring valuables to the hospital.            [facility] is not responsible for any belongings or valuables.  Do NOT Smoke (Tobacco/Vaping) 24 hours prior to your procedure If you use a CPAP at night, you may bring all equipment for your overnight stay.   Contacts, glasses, dentures or bridgework may not be worn into surgery.      For patients admitted to the hospital, discharge time will be determined by your treatment team.   Patients  discharged the day of surgery will not be allowed to drive home, and someone needs to stay with them for 24 hours.    Special instructions:   [facility]- Preparing For Surgery  Before surgery, you can play an important role. Because skin is not sterile, your skin needs to be as free of germs as possible. You can reduce the number of germs on your skin by washing with CHG (chlorahexidine gluconate) Soap before surgery.  CHG is an antiseptic cleaner which kills germs and bonds with the skin to continue killing germs even after washing.    Oral Hygiene is also important to reduce your risk of infection.  Remember - BRUSH YOUR TEETH THE MORNING OF SURGERY WITH YOUR REGULAR TOOTHPASTE  Please do not use if you have an allergy to CHG or antibacterial soaps. If your skin becomes reddened/irritated stop using the CHG.  Do not shave (including legs and underarms) for at least 48 hours prior to first CHG shower. It is OK to shave your face.  Please follow these instructions carefully.   Shower the NIGHT BEFORE SURGERY and the MORNING OF SURGERY with DIAL Soap.   Pat yourself dry with a CLEAN  TOWEL.  Wear CLEAN PAJAMAS to bed the night before surgery  Place CLEAN SHEETS on your bed the night of your first shower and DO NOT SLEEP WITH PETS.   Day of Surgery: Please shower morning of surgery  Wear Clean/Comfortable clothing the morning of surgery Do not apply any deodorants/lotions.   Remember to brush your teeth WITH YOUR REGULAR TOOTHPASTE.   Questions were answered. Patient verbalized understanding of instructions.

## 2023-03-11 ENCOUNTER — Encounter (HOSPITAL_COMMUNITY)
Admission: RE | Disposition: A | Discharge status: Home / Self Care | Payer: Self-pay | Source: Home / Self Care | Attending: Emergency Medicine

## 2023-03-11 ENCOUNTER — Ambulatory Visit (HOSPITAL_COMMUNITY)
Admission: RE | Admit: 2023-03-11 | Discharge: 2023-03-11 | Disposition: A | Discharge status: Home / Self Care | Payer: Medicare Other | Attending: Emergency Medicine | Admitting: Emergency Medicine

## 2023-03-11 ENCOUNTER — Ambulatory Visit (HOSPITAL_COMMUNITY): Payer: Medicare Other

## 2023-03-11 ENCOUNTER — Ambulatory Visit (HOSPITAL_COMMUNITY): Payer: Medicare Other | Admitting: Anesthesiology

## 2023-03-11 ENCOUNTER — Ambulatory Visit (HOSPITAL_BASED_OUTPATIENT_CLINIC_OR_DEPARTMENT_OTHER): Payer: Medicare Other | Admitting: Anesthesiology

## 2023-03-11 ENCOUNTER — Encounter (HOSPITAL_COMMUNITY): Payer: Self-pay | Admitting: Emergency Medicine

## 2023-03-11 DIAGNOSIS — Z48813 Encounter for surgical aftercare following surgery on the respiratory system: Secondary | ICD-10-CM | POA: Diagnosis not present

## 2023-03-11 DIAGNOSIS — J439 Emphysema, unspecified: Secondary | ICD-10-CM | POA: Diagnosis not present

## 2023-03-11 DIAGNOSIS — Z800 Family history of malignant neoplasm of digestive organs: Secondary | ICD-10-CM | POA: Diagnosis not present

## 2023-03-11 DIAGNOSIS — R911 Solitary pulmonary nodule: Secondary | ICD-10-CM | POA: Diagnosis not present

## 2023-03-11 DIAGNOSIS — I10 Essential (primary) hypertension: Secondary | ICD-10-CM | POA: Diagnosis not present

## 2023-03-11 DIAGNOSIS — F17210 Nicotine dependence, cigarettes, uncomplicated: Secondary | ICD-10-CM | POA: Diagnosis not present

## 2023-03-11 DIAGNOSIS — Z808 Family history of malignant neoplasm of other organs or systems: Secondary | ICD-10-CM | POA: Diagnosis not present

## 2023-03-11 DIAGNOSIS — J9811 Atelectasis: Secondary | ICD-10-CM | POA: Diagnosis not present

## 2023-03-11 DIAGNOSIS — I700 Atherosclerosis of aorta: Secondary | ICD-10-CM | POA: Diagnosis not present

## 2023-03-11 DIAGNOSIS — C3412 Malignant neoplasm of upper lobe, left bronchus or lung: Secondary | ICD-10-CM | POA: Diagnosis not present

## 2023-03-11 DIAGNOSIS — J449 Chronic obstructive pulmonary disease, unspecified: Secondary | ICD-10-CM | POA: Diagnosis not present

## 2023-03-11 DIAGNOSIS — R846 Abnormal cytological findings in specimens from respiratory organs and thorax: Secondary | ICD-10-CM | POA: Diagnosis not present

## 2023-03-11 DIAGNOSIS — K219 Gastro-esophageal reflux disease without esophagitis: Secondary | ICD-10-CM | POA: Diagnosis not present

## 2023-03-11 HISTORY — PX: BRONCHIAL BIOPSY: SHX5109

## 2023-03-11 HISTORY — PX: FIDUCIAL MARKER PLACEMENT: SHX6858

## 2023-03-11 HISTORY — DX: Cardiac murmur, unspecified: R01.1

## 2023-03-11 HISTORY — PX: BRONCHIAL NEEDLE ASPIRATION BIOPSY: SHX5106

## 2023-03-11 HISTORY — DX: Gastro-esophageal reflux disease without esophagitis: K21.9

## 2023-03-11 HISTORY — PX: BRONCHIAL BRUSHINGS: SHX5108

## 2023-03-11 LAB — CBC
HCT: 38.6 % — ABNORMAL LOW (ref 39.0–52.0)
Hemoglobin: 12.7 g/dL — ABNORMAL LOW (ref 13.0–17.0)
MCH: 30.6 pg (ref 26.0–34.0)
MCHC: 32.9 g/dL (ref 30.0–36.0)
MCV: 93 fL (ref 80.0–100.0)
Platelets: 268 10*3/uL (ref 150–400)
RBC: 4.15 MIL/uL — ABNORMAL LOW (ref 4.22–5.81)
RDW: 14.8 % (ref 11.5–15.5)
WBC: 7.5 10*3/uL (ref 4.0–10.5)
nRBC: 0 % (ref 0.0–0.2)

## 2023-03-11 SURGERY — BRONCHOSCOPY, WITH BIOPSY USING ELECTROMAGNETIC NAVIGATION
Anesthesia: General | Laterality: Left

## 2023-03-11 MED ORDER — PROPOFOL 10 MG/ML IV BOLUS
INTRAVENOUS | Status: DC | PRN
  Administered 2023-03-11: 150 mg via INTRAVENOUS

## 2023-03-11 MED ORDER — GLYCOPYRROLATE 0.2 MG/ML IJ SOLN
INTRAMUSCULAR | Status: DC | PRN
  Administered 2023-03-11: .4 mg via INTRAVENOUS

## 2023-03-11 MED ORDER — PHENYLEPHRINE HCL-NACL 20-0.9 MG/250ML-% IV SOLN
INTRAVENOUS | Status: DC | PRN
  Administered 2023-03-11: 25 ug/min via INTRAVENOUS

## 2023-03-11 MED ORDER — ACETAMINOPHEN 500 MG PO TABS
1000.0000 mg | ORAL_TABLET | Freq: Once | ORAL | Status: AC
  Administered 2023-03-11: 1000 mg via ORAL
  Filled 2023-03-11: qty 2

## 2023-03-11 MED ORDER — AMISULPRIDE (ANTIEMETIC) 5 MG/2ML IV SOLN: 10.0000 mg | Freq: Once | INTRAVENOUS | Status: DC | PRN

## 2023-03-11 MED ORDER — SODIUM CHLORIDE 0.9 % IV SOLN: INTRAVENOUS | Status: DC | PRN

## 2023-03-11 MED ORDER — LIDOCAINE 2% (20 MG/ML) 5 ML SYRINGE
INTRAMUSCULAR | Status: DC | PRN
  Administered 2023-03-11: 60 mg via INTRAVENOUS

## 2023-03-11 MED ORDER — CHLORHEXIDINE GLUCONATE 0.12 % MT SOLN
OROMUCOSAL | Status: AC
  Administered 2023-03-11: 15 mL via OROMUCOSAL
  Filled 2023-03-11: qty 15

## 2023-03-11 MED ORDER — ROCURONIUM BROMIDE 10 MG/ML (PF) SYRINGE
PREFILLED_SYRINGE | INTRAVENOUS | Status: DC | PRN
  Administered 2023-03-11: 60 mg via INTRAVENOUS

## 2023-03-11 MED ORDER — OXYCODONE HCL 5 MG/5ML PO SOLN: 5.0000 mg | Freq: Once | ORAL | Status: DC | PRN

## 2023-03-11 MED ORDER — ONDANSETRON HCL 4 MG/2ML IJ SOLN: 4.0000 mg | Freq: Once | INTRAMUSCULAR | Status: DC | PRN

## 2023-03-11 MED ORDER — NEOSTIGMINE METHYLSULFATE 10 MG/10ML IV SOLN
INTRAVENOUS | Status: DC | PRN
  Administered 2023-03-11: 3 mg via INTRAVENOUS

## 2023-03-11 MED ORDER — DEXAMETHASONE SODIUM PHOSPHATE 10 MG/ML IJ SOLN
INTRAMUSCULAR | Status: DC | PRN
  Administered 2023-03-11: 5 mg via INTRAVENOUS

## 2023-03-11 MED ORDER — OXYCODONE HCL 5 MG PO TABS: 5.0000 mg | ORAL_TABLET | Freq: Once | ORAL | Status: DC | PRN

## 2023-03-11 MED ORDER — FENTANYL CITRATE (PF) 100 MCG/2ML IJ SOLN: 25.0000 ug | INTRAMUSCULAR | Status: DC | PRN

## 2023-03-11 MED ORDER — CHLORHEXIDINE GLUCONATE 0.12 % MT SOLN
15.0000 mL | Freq: Once | OROMUCOSAL | Status: AC
  Filled 2023-03-11: qty 15

## 2023-03-11 MED ORDER — ONDANSETRON HCL 4 MG/2ML IJ SOLN
INTRAMUSCULAR | Status: DC | PRN
  Administered 2023-03-11: 4 mg via INTRAVENOUS

## 2023-03-11 MED ORDER — PROPOFOL 500 MG/50ML IV EMUL
INTRAVENOUS | Status: DC | PRN
  Administered 2023-03-11: 25 ug/kg/min via INTRAVENOUS

## 2023-03-11 SURGICAL SUPPLY — 1 items: Fiducial Marker IMPLANT

## 2023-03-11 NOTE — Interval H&P Note (Signed)
History and Physical Interval Note:  03/11/2023 10:05 AM  Brandon Mckinney  has presented today for surgery, with the diagnosis of lingular nodule left lung.  The various methods of treatment have been discussed with the patient and family. After consideration of risks, benefits and other options for treatment, the patient has consented to  Procedure(s): ROBOTIC ASSISTED NAVIGATIONAL BRONCHOSCOPY (Left) as a surgical intervention.  The patient's history has been reviewed, patient examined, no change in status, stable for surgery.  I have reviewed the patient's chart and labs.  Questions were answered to the patient's satisfaction.     Leslye Peer

## 2023-03-11 NOTE — Discharge Instructions (Addendum)
Flexible Bronchoscopy, Care After This sheet gives you information about how to care for yourself after your test. Your doctor may also give you more specific instructions. If you have problems or questions, contact your doctor. Follow these instructions at home: Eating and drinking When your numbness is gone and your cough and gag reflexes have come back, you may: Eat only soft foods. Slowly drink liquids. The day after the test, go back to your normal diet. Driving Do not drive for 24 hours if you were given a medicine to help you relax (sedative). Do not drive or use heavy machinery while taking prescription pain medicine. General instructions  Take over-the-counter and prescription medicines only as told by your doctor. Return to your normal activities as told. Ask what activities are safe for you. Do not use any products that have nicotine or tobacco in them. This includes cigarettes and e-cigarettes. If you need help quitting, ask your doctor. Keep all follow-up visits as told by your doctor. This is important. It is very important if you had a tissue sample (biopsy) taken. Get help right away if: You have shortness of breath that gets worse. You get light-headed. You feel like you are going to pass out (faint). You have chest pain. You cough up: More than a little blood. More blood than before. Summary Do not eat or drink anything (not even water) for 2 hours after your test, or until your numbing medicine wears off. Do not use cigarettes. Do not use e-cigarettes. Get help right away if you have chest pain.  Please call our office for any questions or concerns.  [phone].  This information is not intended to replace advice given to you by your health care provider. Make sure you discuss any questions you have with your health care provider. Document Released: 02/18/2009 Document Revised: 04/05/2017 Document Reviewed: 05/11/2016 Elsevier Patient Education  2020 Elsevier  Inc.  

## 2023-03-11 NOTE — Transfer of Care (Signed)
Immediate Anesthesia Transfer of Care Note  Patient: Lakin Bussey  Procedure(s) Performed: ROBOTIC ASSISTED NAVIGATIONAL BRONCHOSCOPY (Left) BRONCHIAL NEEDLE ASPIRATION BIOPSIES BRONCHIAL BRUSHINGS BRONCHIAL BIOPSIES FIDUCIAL MARKER PLACEMENT  Patient Location: PACU  Anesthesia Type:General  Level of Consciousness: drowsy  Airway & Oxygen Therapy: Patient Spontanous Breathing and Patient connected to face mask oxygen  Post-op Assessment: Report given to RN and Post -op Vital signs reviewed and stable  Post vital signs: Reviewed and stable  Last Vitals:  Vitals Value Taken Time  BP 132/79 03/11/23 1317  Temp 36.7 C 03/11/23 1317  Pulse 82 03/11/23 1326  Resp 18 03/11/23 1326  SpO2 97 % 03/11/23 1326  Vitals shown include unfiled device data.  Last Pain:  Vitals:   03/11/23 1317  TempSrc:   PainSc: Asleep         Complications: No notable events documented.

## 2023-03-11 NOTE — Anesthesia Preprocedure Evaluation (Addendum)
Anesthesia Evaluation  Patient identified by MRN, date of birth, ID band Patient awake    Reviewed: Allergy & Precautions, NPO status , Patient's Chart, lab work & pertinent test results  Airway Mallampati: I  TM Distance: >3 FB Neck ROM: Full    Dental  (+) Dental Advisory Given, Edentulous Upper, Missing   Pulmonary COPD,  COPD inhaler, Current Smoker lingular nodule Current smoker, 50 pack year history  Currently 1/2ppd Rarely uses albuterol, took spiriva and symbicort this AM   Pulmonary exam normal breath sounds clear to auscultation       Cardiovascular hypertension (162/78 preop, no home meds), Normal cardiovascular exam Rhythm:Regular Rate:Normal     Neuro/Psych negative neurological ROS  negative psych ROS   GI/Hepatic Neg liver ROS,GERD  Controlled,,  Endo/Other  negative endocrine ROS    Renal/GU negative Renal ROS  negative genitourinary   Musculoskeletal negative musculoskeletal ROS (+)    Abdominal   Peds  Hematology negative hematology ROS (+) Hb 12.7   Anesthesia Other Findings   Reproductive/Obstetrics negative OB ROS                             Anesthesia Physical Anesthesia Plan  ASA: 3  Anesthesia Plan: General   Post-op Pain Management: Tylenol PO (pre-op)*   Induction: Intravenous  PONV Risk Score and Plan: Ondansetron, Dexamethasone, Midazolam and Treatment may vary due to age or medical condition  Airway Management Planned: Oral ETT  Additional Equipment: None  Intra-op Plan:   Post-operative Plan: Extubation in OR  Informed Consent: I have reviewed the patients History and Physical, chart, labs and discussed the procedure including the risks, benefits and alternatives for the proposed anesthesia with the patient or authorized representative who has indicated his/her understanding and acceptance.     Dental advisory given  Plan Discussed with:  CRNA  Anesthesia Plan Comments:        Anesthesia Quick Evaluation

## 2023-03-11 NOTE — Anesthesia Procedure Notes (Signed)
Procedure Name: Intubation Date/Time: 03/11/2023 12:15 PM  Performed by: April Holding, CRNAPre-anesthesia Checklist: Patient identified, Emergency Drugs available, Suction available and Patient being monitored Patient Re-evaluated:Patient Re-evaluated prior to induction Oxygen Delivery Method: Circle System Utilized Preoxygenation: Pre-oxygenation with 100% oxygen Induction Type: IV induction Ventilation: Mask ventilation without difficulty Laryngoscope Size: Miller and 2 Grade View: Grade I Tube type: Oral Tube size: 8.5 mm Number of attempts: 1 Airway Equipment and Method: Stylet and Oral airway Placement Confirmation: ETT inserted through vocal cords under direct vision, positive ETCO2 and breath sounds checked- equal and bilateral Secured at: 23 cm Tube secured with: Tape Dental Injury: Teeth and Oropharynx as per pre-operative assessment

## 2023-03-11 NOTE — Anesthesia Postprocedure Evaluation (Signed)
Anesthesia Post Note  Patient: Brandon Mckinney  Procedure(s) Performed: ROBOTIC ASSISTED NAVIGATIONAL BRONCHOSCOPY (Left) BRONCHIAL NEEDLE ASPIRATION BIOPSIES BRONCHIAL BRUSHINGS BRONCHIAL BIOPSIES FIDUCIAL MARKER PLACEMENT     Patient location during evaluation: PACU Anesthesia Type: General Level of consciousness: awake and alert, oriented and patient cooperative Pain management: pain level controlled Vital Signs Assessment: post-procedure vital signs reviewed and stable Respiratory status: spontaneous breathing, nonlabored ventilation and respiratory function stable Cardiovascular status: blood pressure returned to baseline and stable Postop Assessment: no apparent nausea or vomiting Anesthetic complications: no   No notable events documented.  Last Vitals:  Vitals:   03/11/23 1347 03/11/23 1400  BP: 109/61 112/67  Pulse: 78 77  Resp: 14 15  Temp:  36.7 C  SpO2: 98% 97%    Last Pain:  Vitals:   03/11/23 1400  TempSrc:   PainSc: 0-No pain                 Lannie Fields

## 2023-03-11 NOTE — Op Note (Signed)
Video Bronchoscopy with Robotic Assisted Bronchoscopic Navigation   Date of Operation: 03/11/2023   Pre-op Diagnosis: Left upper lobe nodule  Post-op Diagnosis: Same  Surgeon: Levy Pupa  Assistants: None  Anesthesia: General endotracheal anesthesia  Operation: Flexible video fiberoptic bronchoscopy with robotic assistance and biopsies.  Estimated Blood Loss: Minimal  Complications: None  Indications and History: Brandon Mckinney is a 67 y.o. male with history of tobacco use.  He presentation lung cancer screening program was found to have a 14 mm left upper lobe nodule suspicious for malignancy.  Recommendation made Trelegy tissue diagnosis via robotic assisted navigational bronchoscopy. The risks, benefits, complications, treatment options and expected outcomes were discussed with the patient.  The possibilities of pneumothorax, pneumonia, reaction to medication, pulmonary aspiration, perforation of a viscus, bleeding, failure to diagnose a condition and creating a complication requiring transfusion or operation were discussed with the patient who freely signed the consent.    Description of Procedure: The patient was seen in the Preoperative Area, was examined and was deemed appropriate to proceed.  The patient was taken to [facility] end[facility] room 3, identified as Brandon Mckinney and the procedure verified as Flexible Video Fiberoptic Bronchoscopy.  A Time Out was held and the above information confirmed.   Prior to the date of the procedure a high-resolution CT scan of the chest was performed. Utilizing ION software program a virtual tracheobronchial tree was generated to allow the creation of distinct navigation pathways to the patient's parenchymal abnormalities. After being taken to the operating room general anesthesia was initiated and the patient  was orally intubated. The video fiberoptic bronchoscope was introduced via the endotracheal tube and a general inspection was performed which showed  normal right and left lung anatomy. Aspiration of the bilateral mainstems was completed to remove any remaining secretions. Robotic catheter inserted into patient's endotracheal tube.   Target #1 left upper lobe nodule: The distinct navigation pathways prepared prior to this procedure were then utilized to navigate to patient's lesion identified on CT scan. The robotic catheter was secured into place and the vision probe was withdrawn.  Lesion location was approximated using fluoroscopy.  Local registration and targeting was performed using Cios three-dimensional imaging. Under fluoroscopic guidance transbronchial needle brushings, transbronchial needle biopsies, and transbronchial forceps biopsies were performed to be sent for cytology and pathology.  Under fluoroscopic guidance a single fiducial marker was placed adjacent to the nodule  At the end of the procedure a general airway inspection was performed and there was no evidence of active bleeding. The bronchoscope was removed.  The patient tolerated the procedure well. There was no significant blood loss and there were no obvious complications. A post-procedural chest x-ray is pending.  Samples Target #1: 1. Transbronchial needle brushings from left upper lobe nodule 2. Transbronchial Wang needle biopsies from left upper lobe nodule 3. Transbronchial forceps biopsies from left upper lobe nodule  Plans:  The patient will be discharged from the PACU to home when recovered from anesthesia and after chest x-ray is reviewed. We will review the cytology, pathology and microbiology results with the patient when they become available. Outpatient followup will be with Dr. Delton Coombes and Saralyn Pilar, NP.   Levy Pupa, MD, PhD 03/11/2023, 1:03 PM [facility] [phone] or if no answer before 7:00PM call [phone] For any issues after 7:00PM please call eLink [phone]

## 2023-03-13 ENCOUNTER — Encounter (HOSPITAL_COMMUNITY): Payer: Self-pay | Admitting: Emergency Medicine

## 2023-03-13 LAB — CYTOLOGY - NON PAP

## 2023-03-15 ENCOUNTER — Ambulatory Visit: Payer: Medicare Other | Admitting: Acute Care

## 2023-03-15 ENCOUNTER — Encounter: Payer: Self-pay | Admitting: Acute Care

## 2023-03-15 ENCOUNTER — Telehealth: Payer: Self-pay | Admitting: Acute Care

## 2023-03-15 VITALS — BP 130/70 | HR 79 | Ht 69.5 in | Wt 149.6 lb

## 2023-03-15 DIAGNOSIS — F17210 Nicotine dependence, cigarettes, uncomplicated: Secondary | ICD-10-CM | POA: Diagnosis not present

## 2023-03-15 DIAGNOSIS — R932 Abnormal findings on diagnostic imaging of liver and biliary tract: Secondary | ICD-10-CM | POA: Diagnosis not present

## 2023-03-15 DIAGNOSIS — Z9889 Other specified postprocedural states: Secondary | ICD-10-CM

## 2023-03-15 DIAGNOSIS — R911 Solitary pulmonary nodule: Secondary | ICD-10-CM

## 2023-03-15 DIAGNOSIS — C3492 Malignant neoplasm of unspecified part of left bronchus or lung: Secondary | ICD-10-CM | POA: Diagnosis not present

## 2023-03-15 DIAGNOSIS — Z87891 Personal history of nicotine dependence: Secondary | ICD-10-CM

## 2023-03-15 NOTE — Telephone Encounter (Signed)
Ladies, please watch for this MRI abdomen result so I can ensure it is incorporated into his staging process, and referrals are made appropriately. Thanks so much

## 2023-03-15 NOTE — Progress Notes (Signed)
History of Present Illness Brandon Mckinney is a 67 y.o. male current every day smoker with ***   03/15/2023  Test Results: Cytology 03/11/2023 A. LUNG, LUL TARGET #1, FINE NEEDLE ASPIRATION  BIOPSY:  - Non-small cell carcinoma   Note: There is limited tissue and the cellblock, but there are scattered  atypical TTF positive cells which are negative for CD56 and p63 most  consistent with an adenocarcinoma.  Dr. Kenard Gower as peer-reviewed the  case and agrees with the interpretation.   B. LUNG, LUL TARGET #1, BRUSHING:  - Non-small cell carcinoma   PET scan 01/24/2023 13 mm irregular lingular nodule, compatible with primary bronchogenic carcinoma.   No evidence of metastatic disease.   Suspected abnormal soft tissue along the gallbladder fundus, raising concern for early gallbladder carcinoma. Abdominal MRI with/without contrast is suggested for further evaluation.       Latest Ref Rng & Units 03/11/2023    8:42 AM 02/12/2020   11:17 AM  CBC  WBC 4.0 - 10.5 K/uL 7.5  8.9   Hemoglobin 13.0 - 17.0 g/dL 81.1  91.4   Hematocrit 39.0 - 52.0 % 38.6  43.8   Platelets 150 - 400 K/uL 268  349.0        Latest Ref Rng & Units 02/12/2020   11:17 AM  BMP  Glucose 70 - 99 mg/dL 94   BUN 6 - 23 mg/dL 12   Creatinine 7.82 - 1.50 mg/dL 9.56   Sodium 213 - 086 mEq/L 136   Potassium 3.5 - 5.1 mEq/L 4.0   Chloride 96 - 112 mEq/L 103   CO2 19 - 32 mEq/L 28   Calcium 8.4 - 10.5 mg/dL 9.5     BNP No results found for: "BNP"  ProBNP No results found for: "PROBNP"  PFT    Component Value Date/Time   FEV1PRE 1.79 04/18/2020 0957   FEV1POST 1.85 04/18/2020 0957   FVCPRE 3.45 04/18/2020 0957   FVCPOST 3.41 04/18/2020 0957   TLC 7.05 04/18/2020 0957   DLCOUNC 13.46 04/18/2020 0957   PREFEV1FVCRT 52 04/18/2020 0957   PSTFEV1FVCRT 54 04/18/2020 0957    DG Chest Port 1 View  Result Date: 03/11/2023 CLINICAL DATA:  Status post bronchoscopy. EXAM: PORTABLE CHEST 1 VIEW COMPARISON:   PET-01/24/2023. Chest CT 01/02/2023. Chest radiographs 11/22/2021. FINDINGS: The cardiomediastinal silhouette is unchanged with normal heart size. Aortic atherosclerosis is noted. A new fiducial projects lateral to the left hilum. Minor left basilar opacity likely reflects atelectasis. No sizable pleural effusion or pneumothorax is identified. No acute osseous abnormality is seen. IMPRESSION: 1. No pneumothorax. 2. Minor left basilar atelectasis. Electronically Signed   By: Sebastian Ache M.D.   On: 03/11/2023 16:08   DG C-ARM BRONCHOSCOPY  Result Date: 03/11/2023 C-ARM BRONCHOSCOPY: Fluoroscopy was utilized by the requesting physician.  No radiographic interpretation.     Past medical hx Past Medical History:  Diagnosis Date   COPD (chronic obstructive pulmonary disease) (HCC)    GERD (gastroesophageal reflux disease)    Heart murmur    per patient no one MD has said anything about in years     Social History   Tobacco Use   Smoking status: Every Day    Current packs/day: 1.00    Average packs/day: 1 pack/day for 48.9 years (48.9 ttl pk-yrs)    Types: Cigarettes    Start date: 70   Smokeless tobacco: Never   Tobacco comments:    Less then 1pk daily/07/11/22  Vaping Use  Vaping status: Never Used  Substance Use Topics   Alcohol use: Never   Drug use: Never    Brandon Mckinney reports that he has been smoking cigarettes. He started smoking about 48 years ago. He has a 48.9 pack-year smoking history. He has never used smokeless tobacco. He reports that he does not drink alcohol and does not use drugs.  Tobacco Cessation: Ready to quit: Not Answered Counseling given: Not Answered Tobacco comments: Less then 1pk daily/07/11/22   Past surgical hx, Family hx, Social hx all reviewed.  Current Outpatient Medications on File Prior to Visit  Medication Sig   albuterol (VENTOLIN HFA) 108 (90 Base) MCG/ACT inhaler Inhale 1 puff into the lungs every 6 (six) hours as needed for wheezing or  shortness of breath.   budesonide-formoterol (SYMBICORT) 160-4.5 MCG/ACT inhaler Inhale 2 puffs into the lungs in the morning and at bedtime.   omeprazole (PRILOSEC) 40 MG capsule Take 1 capsule (40 mg total) by mouth daily.   Tiotropium Bromide Monohydrate (SPIRIVA RESPIMAT) 2.5 MCG/ACT AERS Inhale 2 puffs into the lungs daily.   Varenicline Tartrate, Starter, (CHANTIX STARTING MONTH PAK) 0.5 MG X 11 & 1 MG X 42 TBPK Take 1 tablet by mouth daily.   No current facility-administered medications on file prior to visit.     No Known Allergies  Review Of Systems:  Constitutional:   No  weight loss, night sweats,  Fevers, chills, fatigue, or  lassitude.  HEENT:   No headaches,  Difficulty swallowing,  Tooth/dental problems, or  Sore throat,                No sneezing, itching, ear ache, nasal congestion, post nasal drip,   CV:  No chest pain,  Orthopnea, PND, swelling in lower extremities, anasarca, dizziness, palpitations, syncope.   GI  No heartburn, indigestion, abdominal pain, nausea, vomiting, diarrhea, change in bowel habits, loss of appetite, bloody stools.   Resp: No shortness of breath with exertion or at rest.  No excess mucus, no productive cough,  No non-productive cough,  No coughing up of blood.  No change in color of mucus.  No wheezing.  No chest wall deformity  Skin: no rash or lesions.  GU: no dysuria, change in color of urine, no urgency or frequency.  No flank pain, no hematuria   MS:  No joint pain or swelling.  No decreased range of motion.  No back pain.  Psych:  No change in mood or affect. No depression or anxiety.  No memory loss.   Vital Signs BP 130/70 (BP Location: Right Arm, Cuff Size: Large)   Pulse 79   Ht 5' 9.5" (1.765 m)   Wt 149 lb 9.6 oz (67.9 kg)   SpO2 98%   BMI 21.78 kg/m    Physical Exam:  General- No distress,  A&Ox3 ENT: No sinus tenderness, TM clear, pale nasal mucosa, no oral exudate,no post nasal drip, no LAN Cardiac: S1, S2,  regular rate and rhythm, no murmur Chest: No wheeze/ rales/ dullness; no accessory muscle use, no nasal flaring, no sternal retractions Abd.: Soft Non-tender Ext: No clubbing cyanosis, edema Neuro:  normal strength Skin: No rashes, warm and dry Psych: normal mood and behavior   Assessment/Plan  No problem-specific Assessment & Plan notes found for this encounter.    Bevelyn Ngo, NP 03/15/2023  9:23 AM

## 2023-03-15 NOTE — Patient Instructions (Signed)
It is good to see you today. Your biopsy was positive for non small cell lung cancer. I have referred you to both medical oncology and radiation oncology for treatment options. You will get phone calls to get these scheduled.  I have ordered an MRI of the abdomen to better evaluate the gall bladder finding.  If this is not a cancer you may be a surgical candidate. Your Pulmonary Function tests from 2 years ago are borderline, but if you would like to be evaluated by surgery once we have the MRI abdomen results we can arrange that.  Quit smoking. You can receive free nicotine replacement therapy ( patches, gum or mints) by calling 1-800-QUIT NOW. Please call so we can get you on the path to becoming  a non-smoker. I know it is hard, but you can do this!  Other options for assistance in smoking cessation ( As covered by your insurance benefits)  Hypnosis for smoking cessation  [employer] [phone]  Acupuncture for smoking cessation  [facility] [phone]   Call if you have any questions

## 2023-03-19 ENCOUNTER — Encounter: Payer: Self-pay | Admitting: Acute Care

## 2023-03-20 ENCOUNTER — Ambulatory Visit: Payer: Medicare Other | Admitting: Emergency Medicine

## 2023-03-20 ENCOUNTER — Encounter: Payer: Self-pay | Admitting: Emergency Medicine

## 2023-03-20 VITALS — BP 124/68 | HR 85 | Temp 98.4°F | Ht 69.5 in | Wt 148.8 lb

## 2023-03-20 DIAGNOSIS — Z8042 Family history of malignant neoplasm of prostate: Secondary | ICD-10-CM | POA: Diagnosis not present

## 2023-03-20 DIAGNOSIS — F17200 Nicotine dependence, unspecified, uncomplicated: Secondary | ICD-10-CM | POA: Diagnosis not present

## 2023-03-20 DIAGNOSIS — K219 Gastro-esophageal reflux disease without esophagitis: Secondary | ICD-10-CM

## 2023-03-20 DIAGNOSIS — J439 Emphysema, unspecified: Secondary | ICD-10-CM

## 2023-03-20 LAB — COMPREHENSIVE METABOLIC PANEL
ALT: 10 U/L (ref 0–53)
AST: 18 U/L (ref 0–37)
Albumin: 4.1 g/dL (ref 3.5–5.2)
Alkaline Phosphatase: 79 U/L (ref 39–117)
BUN: 11 mg/dL (ref 6–23)
CO2: 32 meq/L (ref 19–32)
Calcium: 9.4 mg/dL (ref 8.4–10.5)
Chloride: 103 meq/L (ref 96–112)
Creatinine, Ser: 1.04 mg/dL (ref 0.40–1.50)
GFR: 73.15 mL/min (ref >60.00)
Glucose, Bld: 78 mg/dL (ref 70–99)
Potassium: 4.2 meq/L (ref 3.5–5.1)
Sodium: 142 meq/L (ref 135–145)
Total Bilirubin: 0.6 mg/dL (ref 0.2–1.2)
Total Protein: 7.1 g/dL (ref 6.0–8.3)

## 2023-03-20 LAB — CBC WITH DIFFERENTIAL/PLATELET
Basophils Absolute: 0 10*3/uL (ref 0.0–0.1)
Basophils Relative: 0.6 % (ref 0.0–3.0)
Eosinophils Absolute: 0.1 10*3/uL (ref 0.0–0.7)
Eosinophils Relative: 0.8 % (ref 0.0–5.0)
HCT: 42.2 % (ref 39.0–52.0)
Hemoglobin: 13.9 g/dL (ref 13.0–17.0)
Lymphocytes Relative: 36.7 % (ref 12.0–46.0)
Lymphs Abs: 3 10*3/uL (ref 0.7–4.0)
MCHC: 32.9 g/dL (ref 30.0–36.0)
MCV: 94.8 fL (ref 78.0–100.0)
Monocytes Absolute: 0.7 10*3/uL (ref 0.1–1.0)
Monocytes Relative: 9 % (ref 3.0–12.0)
Neutro Abs: 4.3 10*3/uL (ref 1.4–7.7)
Neutrophils Relative %: 52.9 % (ref 43.0–77.0)
Platelets: 316 10*3/uL (ref 150.0–400.0)
RBC: 4.45 Mil/uL (ref 4.22–5.81)
RDW: 14.7 % (ref 11.5–15.5)
WBC: 8.1 10*3/uL (ref 4.0–10.5)

## 2023-03-20 LAB — LIPID PANEL
Cholesterol: 164 mg/dL (ref 0–200)
HDL: 49 mg/dL (ref >39.00)
LDL Cholesterol: 101 mg/dL — ABNORMAL HIGH (ref 0–99)
NonHDL: 115.11
Total CHOL/HDL Ratio: 3
Triglycerides: 70 mg/dL (ref 0.0–149.0)
VLDL: 14 mg/dL (ref 0.0–40.0)

## 2023-03-20 LAB — PSA: PSA: 0.82 ng/mL (ref 0.10–4.00)

## 2023-03-20 LAB — HEMOGLOBIN A1C: Hgb A1c MFr Bld: 5.4 % (ref 4.6–6.5)

## 2023-03-20 NOTE — Assessment & Plan Note (Signed)
Clinically stable.  No complications No concerns today. Continues Symbicort 2 puffs twice daily and Spiriva 2 puffs daily Albuterol as rescue inhaler No recent flareups

## 2023-03-20 NOTE — Assessment & Plan Note (Signed)
Clinically stable and asymptomatic at present time

## 2023-03-20 NOTE — Assessment & Plan Note (Signed)
Cardiovascular and cancer risks associated with smoking discussed. °Smoking cessation advice given. °

## 2023-03-20 NOTE — Patient Instructions (Signed)
Health Maintenance After Age 69 After age 69, you are at a higher risk for certain long-term diseases and infections as well as injuries from falls. Falls are a major cause of broken bones and head injuries in people who are older than age 69. Getting regular preventive care can help to keep you healthy and well. Preventive care includes getting regular testing and making lifestyle changes as recommended by your health care provider. Talk with your health care provider about: Which screenings and tests you should have. A screening is a test that checks for a disease when you have no symptoms. A diet and exercise plan that is right for you. What should I know about screenings and tests to prevent falls? Screening and testing are the best ways to find a health problem early. Early diagnosis and treatment give you the best chance of managing medical conditions that are common after age 69. Certain conditions and lifestyle choices may make you more likely to have a fall. Your health care provider may recommend: Regular vision checks. Poor vision and conditions such as cataracts can make you more likely to have a fall. If you wear glasses, make sure to get your prescription updated if your vision changes. Medicine review. Work with your health care provider to regularly review all of the medicines you are taking, including over-the-counter medicines. Ask your health care provider about any side effects that may make you more likely to have a fall. Tell your health care provider if any medicines that you take make you feel dizzy or sleepy. Strength and balance checks. Your health care provider may recommend certain tests to check your strength and balance while standing, walking, or changing positions. Foot health exam. Foot pain and numbness, as well as not wearing proper footwear, can make you more likely to have a fall. Screenings, including: Osteoporosis screening. Osteoporosis is a condition that causes  the bones to get weaker and break more easily. Blood pressure screening. Blood pressure changes and medicines to control blood pressure can make you feel dizzy. Depression screening. You may be more likely to have a fall if you have a fear of falling, feel depressed, or feel unable to do activities that you used to do. Alcohol use screening. Using too much alcohol can affect your balance and may make you more likely to have a fall. Follow these instructions at home: Lifestyle Do not drink alcohol if: Your health care provider tells you not to drink. If you drink alcohol: Limit how much you have to: 0-1 drink a day for women. 0-2 drinks a day for men. Know how much alcohol is in your drink. In the [place] one drink equals one 12 oz bottle of beer (355 mL), one 5 oz glass of wine (148 mL), or one 1 oz glass of hard liquor (44 mL). Do not use any products that contain nicotine or tobacco. These products include cigarettes, chewing tobacco, and vaping devices, such as e-cigarettes. If you need help quitting, ask your health care provider. Activity  Follow a regular exercise program to stay fit. This will help you maintain your balance. Ask your health care provider what types of exercise are appropriate for you. If you need a cane or walker, use it as recommended by your health care provider. Wear supportive shoes that have nonskid soles. Safety  Remove any tripping hazards, such as rugs, cords, and clutter. Install safety equipment such as grab bars in bathrooms and safety rails on stairs. Keep rooms and walkways   well-lit. General instructions Talk with your health care provider about your risks for falling. Tell your health care provider if: You fall. Be sure to tell your health care provider about all falls, even ones that seem minor. You feel dizzy, tiredness (fatigue), or off-balance. Take over-the-counter and prescription medicines only as told by your health care provider. These include  supplements. Eat a healthy diet and maintain a healthy weight. A healthy diet includes low-fat dairy products, low-fat (lean) meats, and fiber from whole grains, beans, and lots of fruits and vegetables. Stay current with your vaccines. Schedule regular health, dental, and eye exams. Summary Having a healthy lifestyle and getting preventive care can help to protect your health and wellness after age 69. Screening and testing are the best way to find a health problem early and help you avoid having a fall. Early diagnosis and treatment give you the best chance for managing medical conditions that are more common for people who are older than age 69. Falls are a major cause of broken bones and head injuries in people who are older than age 69. Take precautions to prevent a fall at home. Work with your health care provider to learn what changes you can make to improve your health and wellness and to prevent falls. This information is not intended to replace advice given to you by your health care provider. Make sure you discuss any questions you have with your health care provider. Document Revised: 09/12/2020 Document Reviewed: 09/12/2020 Elsevier Patient Education  2024 Elsevier Inc.  

## 2023-03-20 NOTE — Progress Notes (Signed)
Brandon Mckinney 67 y.o.   Chief Complaint  Patient presents with   Medical Management of Chronic Issues    6 month f/u for pulmonary.    HISTORY OF PRESENT ILLNESS: This is a 67 y.o. male here for 15-month follow-up of chronic medical conditions Overall doing well.  Has no complaints or medical concerns today. States since last office visit he had a CT scan which showed spot in the lung and prostate.  Had negative lung biopsy.  Urology still pending.   HPI   Prior to Admission medications   Medication Sig Start Date End Date Taking? Authorizing Provider  albuterol (VENTOLIN HFA) 108 (90 Base) MCG/ACT inhaler Inhale 1 puff into the lungs every 6 (six) hours as needed for wheezing or shortness of breath. 01/28/23  Yes Mannam, Praveen, MD  budesonide-formoterol (SYMBICORT) 160-4.5 MCG/ACT inhaler Inhale 2 puffs into the lungs in the morning and at bedtime. 01/28/23 01/28/24 Yes Mannam, Praveen, MD  omeprazole (PRILOSEC) 40 MG capsule Take 1 capsule (40 mg total) by mouth daily. 01/28/23 01/28/24 Yes Mannam, Praveen, MD  Tiotropium Bromide Monohydrate (SPIRIVA RESPIMAT) 2.5 MCG/ACT AERS Inhale 2 puffs into the lungs daily. 02/27/23  Yes Leslye Peer, MD  Varenicline Tartrate, Starter, (CHANTIX STARTING MONTH PAK) 0.5 MG X 11 & 1 MG X 42 TBPK Take 1 tablet by mouth daily. 01/28/23  Yes Chilton Greathouse, MD    No Known Allergies  Patient Active Problem List   Diagnosis Date Noted   Pulmonary nodule 1 cm or greater in diameter 02/27/2023   GERD (gastroesophageal reflux disease) 02/27/2023   Pulmonary emphysema (HCC) 09/27/2020   Thyroid nodule 09/27/2020   Current smoker 09/27/2020   Atherosclerosis of aorta (HCC) 09/27/2020    Past Medical History:  Diagnosis Date   COPD (chronic obstructive pulmonary disease) (HCC)    GERD (gastroesophageal reflux disease)    Heart murmur    per patient no one MD has said anything about in years    Past Surgical History:  Procedure Laterality Date    BRONCHIAL BIOPSY  03/11/2023   Procedure: BRONCHIAL BIOPSIES;  Surgeon: Leslye Peer, MD;  Location: [facility] ENDOSCOPY;  Service: Pulmonary;;   BRONCHIAL BRUSHINGS  03/11/2023   Procedure: BRONCHIAL BRUSHINGS;  Surgeon: Leslye Peer, MD;  Location: [facility] ENDOSCOPY;  Service: Pulmonary;;   BRONCHIAL NEEDLE ASPIRATION BIOPSY  03/11/2023   Procedure: BRONCHIAL NEEDLE ASPIRATION BIOPSIES;  Surgeon: Leslye Peer, MD;  Location: [facility] ENDOSCOPY;  Service: Pulmonary;;   FIDUCIAL MARKER PLACEMENT  03/11/2023   Procedure: FIDUCIAL MARKER PLACEMENT;  Surgeon: Leslye Peer, MD;  Location: [facility] ENDOSCOPY;  Service: Pulmonary;;   PENECTOMY      Social History   Socioeconomic History   Marital status: Married    Spouse name: Not on file   Number of children: Not on file   Years of education: Not on file   Highest education level: Not on file  Occupational History   Not on file  Tobacco Use   Smoking status: Every Day    Current packs/day: 1.00    Average packs/day: 1 pack/day for 48.9 years (48.9 ttl pk-yrs)    Types: Cigarettes    Start date: 1976   Smokeless tobacco: Never   Tobacco comments:    Less then 1pk daily/07/11/22  Vaping Use   Vaping status: Never Used  Substance and Sexual Activity   Alcohol use: Never   Drug use: Never   Sexual activity: Yes  Other Topics Concern   Not  on file  Social History Narrative   Not on file   Social Determinants of Health   Financial Resource Strain: Low Risk  (09/17/2022)   Overall Financial Resource Strain (CARDIA)    Difficulty of Paying Living Expenses: Not hard at all  Food Insecurity: No Food Insecurity (09/17/2022)   Hunger Vital Sign    Worried About Running Out of Food in the Last Year: Never true    Ran Out of Food in the Last Year: Never true  Transportation Needs: No Transportation Needs (09/17/2022)   PRAPARE - Administrator, Civil Service (Medical): No    Lack of Transportation (Non-Medical): No  Physical Activity:  Inactive (09/17/2022)   Exercise Vital Sign    Days of Exercise per Week: 0 days    Minutes of Exercise per Session: 0 min  Stress: No Stress Concern Present (09/17/2022)   [employer] of Occupational Health - Occupational Stress Questionnaire    Feeling of Stress : Not at all  Social Connections: Moderately Isolated (09/17/2022)   Social Connection and Isolation Panel [NHANES]    Frequency of Communication with Friends and Family: Once a week    Frequency of Social Gatherings with Friends and Family: Once a week    Attends Religious Services: 1 to 4 times per year    Active Member of [employer] or Organizations: No    Attends Banker Meetings: Never    Marital Status: Married  Catering manager Violence: Not At Risk (09/17/2022)   Humiliation, Afraid, Rape, and Kick questionnaire    Fear of Current or Ex-Partner: No    Emotionally Abused: No    Physically Abused: No    Sexually Abused: No    No family history on file.   Review of Systems  Constitutional: Negative.  Negative for chills and fever.  HENT: Negative.  Negative for congestion and sore throat.   Respiratory:  Positive for cough (Smoker's cough).   Cardiovascular: Negative.  Negative for chest pain and palpitations.  Gastrointestinal:  Negative for abdominal pain, diarrhea, nausea and vomiting.  Genitourinary: Negative.  Negative for dysuria and hematuria.  Musculoskeletal: Negative.   Skin: Negative.  Negative for rash.  Neurological: Negative.  Negative for dizziness and headaches.  All other systems reviewed and are negative.  Vitals:   03/20/23 1015  BP: 124/68  Pulse: 85  Temp: 98.4 F (36.9 C)  SpO2: 95%    Physical Exam Vitals reviewed.  Constitutional:      Appearance: Normal appearance.  HENT:     Head: Normocephalic.     Mouth/Throat:     Mouth: Mucous membranes are moist.     Pharynx: Oropharynx is clear.  Eyes:     Extraocular Movements: Extraocular movements intact.      Conjunctiva/sclera: Conjunctivae normal.     Pupils: Pupils are equal, round, and reactive to light.  Cardiovascular:     Rate and Rhythm: Normal rate and regular rhythm.     Pulses: Normal pulses.     Heart sounds: Normal heart sounds.  Pulmonary:     Effort: Pulmonary effort is normal.     Breath sounds: Normal breath sounds.  Abdominal:     Palpations: Abdomen is soft.     Tenderness: There is no abdominal tenderness.  Musculoskeletal:     Cervical back: No tenderness.  Lymphadenopathy:     Cervical: No cervical adenopathy.  Skin:    General: Skin is warm and dry.     Capillary Refill:  Capillary refill takes less than 2 seconds.  Neurological:     General: No focal deficit present.     Mental Status: He is alert and oriented to person, place, and time.  Psychiatric:        Mood and Affect: Mood normal.        Behavior: Behavior normal.    ASSESSMENT & PLAN: A total of 43 minutes was spent with the patient and counseling/coordination of care regarding preparing for this visit, review of most recent office visit notes, review of multiple chronic medical conditions under management, review of all medications, review of most recent blood work results, education on nutrition, smoking cessation advice, prognosis, documentation, and need for follow-up.  Problem List Items Addressed This Visit       Respiratory   Pulmonary emphysema (HCC) - Primary    Clinically stable.  No complications No concerns today. Continues Symbicort 2 puffs twice daily and Spiriva 2 puffs daily Albuterol as rescue inhaler No recent flareups      Relevant Orders   Comprehensive metabolic panel   CBC with Differential/Platelet   PSA   Hemoglobin A1c   Lipid panel     Digestive   GERD (gastroesophageal reflux disease)    Clinically stable and asymptomatic at present time      Relevant Orders   Comprehensive metabolic panel   CBC with Differential/Platelet   PSA   Hemoglobin A1c   Lipid  panel     Other   Current smoker    Cardiovascular and cancer risks associated with smoking discussed Smoking cessation advice given      Relevant Orders   Comprehensive metabolic panel   CBC with Differential/Platelet   PSA   Hemoglobin A1c   Lipid panel   Other Visit Diagnoses     Family history of prostate cancer       Relevant Orders   PSA      Patient Instructions  Health Maintenance After Age 51 After age 43, you are at a higher risk for certain long-term diseases and infections as well as injuries from falls. Falls are a major cause of broken bones and head injuries in people who are older than age 53. Getting regular preventive care can help to keep you healthy and well. Preventive care includes getting regular testing and making lifestyle changes as recommended by your health care provider. Talk with your health care provider about: Which screenings and tests you should have. A screening is a test that checks for a disease when you have no symptoms. A diet and exercise plan that is right for you. What should I know about screenings and tests to prevent falls? Screening and testing are the best ways to find a health problem early. Early diagnosis and treatment give you the best chance of managing medical conditions that are common after age 50. Certain conditions and lifestyle choices may make you more likely to have a fall. Your health care provider may recommend: Regular vision checks. Poor vision and conditions such as cataracts can make you more likely to have a fall. If you wear glasses, make sure to get your prescription updated if your vision changes. Medicine review. Work with your health care provider to regularly review all of the medicines you are taking, including over-the-counter medicines. Ask your health care provider about any side effects that may make you more likely to have a fall. Tell your health care provider if any medicines that you take make you feel  dizzy  or sleepy. Strength and balance checks. Your health care provider may recommend certain tests to check your strength and balance while standing, walking, or changing positions. Foot health exam. Foot pain and numbness, as well as not wearing proper footwear, can make you more likely to have a fall. Screenings, including: Osteoporosis screening. Osteoporosis is a condition that causes the bones to get weaker and break more easily. Blood pressure screening. Blood pressure changes and medicines to control blood pressure can make you feel dizzy. Depression screening. You may be more likely to have a fall if you have a fear of falling, feel depressed, or feel unable to do activities that you used to do. Alcohol use screening. Using too much alcohol can affect your balance and may make you more likely to have a fall. Follow these instructions at home: Lifestyle Do not drink alcohol if: Your health care provider tells you not to drink. If you drink alcohol: Limit how much you have to: 0-1 drink a day for women. 0-2 drinks a day for men. Know how much alcohol is in your drink. In the [place] one drink equals one 12 oz bottle of beer (355 mL), one 5 oz glass of wine (148 mL), or one 1 oz glass of hard liquor (44 mL). Do not use any products that contain nicotine or tobacco. These products include cigarettes, chewing tobacco, and vaping devices, such as e-cigarettes. If you need help quitting, ask your health care provider. Activity  Follow a regular exercise program to stay fit. This will help you maintain your balance. Ask your health care provider what types of exercise are appropriate for you. If you need a cane or walker, use it as recommended by your health care provider. Wear supportive shoes that have nonskid soles. Safety  Remove any tripping hazards, such as rugs, cords, and clutter. Install safety equipment such as grab bars in bathrooms and safety rails on stairs. Keep rooms and  walkways well-lit. General instructions Talk with your health care provider about your risks for falling. Tell your health care provider if: You fall. Be sure to tell your health care provider about all falls, even ones that seem minor. You feel dizzy, tiredness (fatigue), or off-balance. Take over-the-counter and prescription medicines only as told by your health care provider. These include supplements. Eat a healthy diet and maintain a healthy weight. A healthy diet includes low-fat dairy products, low-fat (lean) meats, and fiber from whole grains, beans, and lots of fruits and vegetables. Stay current with your vaccines. Schedule regular health, dental, and eye exams. Summary Having a healthy lifestyle and getting preventive care can help to protect your health and wellness after age 1. Screening and testing are the best way to find a health problem early and help you avoid having a fall. Early diagnosis and treatment give you the best chance for managing medical conditions that are more common for people who are older than age 43. Falls are a major cause of broken bones and head injuries in people who are older than age 44. Take precautions to prevent a fall at home. Work with your health care provider to learn what changes you can make to improve your health and wellness and to prevent falls. This information is not intended to replace advice given to you by your health care provider. Make sure you discuss any questions you have with your health care provider. Document Revised: 09/12/2020 Document Reviewed: 09/12/2020 Elsevier Patient Education  2024 [employer].  Edwina Barth, MD [facility]

## 2023-03-21 ENCOUNTER — Other Ambulatory Visit: Payer: Self-pay

## 2023-03-21 NOTE — Progress Notes (Signed)
The proposed treatment discussed in conference is for discussion purpose only and is not a binding recommendation.  The patients have not been physically examined, or presented with their treatment options.  Therefore, final treatment plans cannot be decided.  

## 2023-03-25 NOTE — Progress Notes (Signed)
Radiation Oncology         [phone] ________________________________  Name: Brandon Mckinney        MRN: [id number]  Date of Service: 03/28/2023 DOB: 06-11-1955  ON:[id number], Eilleen Kempf, MD  Icard, Rachel Bo, DO     REFERRING PHYSICIAN: Josephine Igo, DO   DIAGNOSIS: The encounter diagnosis was Malignant neoplasm of upper lobe of left lung (HCC).   HISTORY OF PRESENT ILLNESS: Brandon Mckinney is a 67 y.o. male seen at the request of Dr. Tonia Brooms for a new diagnosis of left lung cancer.  The patient was being followed in the lung cancer screening program and a CT of the chest on 01/02/2023 showed a 13.4 millimeter lesion that was spiculated in the lingular aspect of the left upper lobe.  There was evidence of bullous disease and emphysematous changes in the lungs.  He also had a left thyroid nodule that has been felt to be overall stable from several years ago and has been biopsy-proven to be negative for concern. PET scan on 01/24/2023 showed the lingular nodule in the left lung measuring 13 mm no evidence of metastatic findings were appreciated.  There was suspected abnormal soft tissue along the gallbladder fundus.  A bronchoscopy on 03/11/2023 showed non-small cell carcinoma in the fine-needle aspirate of the left upper lobe target and brushings were also consistent with this finding.  He also underwent an MRI of the abdomen on 03/26/2023 to follow-up on the abnormality in the gallbladder.  This has not been read at the time of consultation.  He is seen today to consider stereotactic body radiotherapy (SBRT); but is also meeting with cardiothoracic surgery if the MRI of the abdomen is negative for concerns in the gallbladder.   PREVIOUS RADIATION THERAPY: No   PAST MEDICAL HISTORY:  Past Medical History:  Diagnosis Date   COPD (chronic obstructive pulmonary disease) (HCC)    GERD (gastroesophageal reflux disease)    Heart murmur    per patient no one MD has said anything about in years    Lung cancer [facility])        PAST SURGICAL HISTORY: Past Surgical History:  Procedure Laterality Date   BRONCHIAL BIOPSY  03/11/2023   Procedure: BRONCHIAL BIOPSIES;  Surgeon: Leslye Peer, MD;  Location: [facility] ENDOSCOPY;  Service: Pulmonary;;   BRONCHIAL BRUSHINGS  03/11/2023   Procedure: BRONCHIAL BRUSHINGS;  Surgeon: Leslye Peer, MD;  Location: [facility] ENDOSCOPY;  Service: Pulmonary;;   BRONCHIAL NEEDLE ASPIRATION BIOPSY  03/11/2023   Procedure: BRONCHIAL NEEDLE ASPIRATION BIOPSIES;  Surgeon: Leslye Peer, MD;  Location: [facility] ENDOSCOPY;  Service: Pulmonary;;   FIDUCIAL MARKER PLACEMENT  03/11/2023   Procedure: FIDUCIAL MARKER PLACEMENT;  Surgeon: Leslye Peer, MD;  Location: [facility] ENDOSCOPY;  Service: Pulmonary;;   PENECTOMY       FAMILY HISTORY: No family history on file.   SOCIAL HISTORY:  reports that he has been smoking cigarettes. He started smoking about 48 years ago. He has a 48.9 pack-year smoking history. He has never used smokeless tobacco. He reports that he does not drink alcohol and does not use drugs.  The patient is married and lives in [place].   ALLERGIES: Patient has no known allergies.   MEDICATIONS:  Current Outpatient Medications  Medication Sig Dispense Refill   albuterol (VENTOLIN HFA) 108 (90 Base) MCG/ACT inhaler Inhale 1 puff into the lungs every 6 (six) hours as needed for wheezing or shortness of breath. 24 g 3   budesonide-formoterol (SYMBICORT) 160-4.5  MCG/ACT inhaler Inhale 2 puffs into the lungs in the morning and at bedtime. 3 each 3   omeprazole (PRILOSEC) 40 MG capsule Take 1 capsule (40 mg total) by mouth daily. 90 capsule 3   Tiotropium Bromide Monohydrate (SPIRIVA RESPIMAT) 2.5 MCG/ACT AERS Inhale 2 puffs into the lungs daily. 4 g 6   Varenicline Tartrate, Starter, (CHANTIX STARTING MONTH PAK) 0.5 MG X 11 & 1 MG X 42 TBPK Take 1 tablet by mouth daily. 1 each 0   No current facility-administered medications for this encounter.     REVIEW OF  SYSTEMS: On review of systems, the patient reports that he is doing well overall.  He reports  that his weight can fluctuate a few pounds but he has not had any significant unintended weight changes, he reports that when he walks longer distances or exerts himself, he can have shortness of breath which seems to resolve with rest.  He is not experiencing any chest pain, tightness or pressure.  No other complaints are verbalized.    PHYSICAL EXAM:  Wt Readings from Last 3 Encounters:  03/28/23 148 lb 3.2 oz (67.2 kg)  03/28/23 148 lb 3.2 oz (67.2 kg)  03/20/23 148 lb 12.8 oz (67.5 kg)   Temp Readings from Last 3 Encounters:  03/28/23 97.8 F (36.6 C)  03/28/23 97.8 F (36.6 C) (Temporal)  03/20/23 98.4 F (36.9 C) (Oral)   BP Readings from Last 3 Encounters:  03/28/23 123/64  03/28/23 123/64  03/20/23 124/68   Pulse Readings from Last 3 Encounters:  03/28/23 86  03/28/23 86  03/20/23 85   Pain Assessment Pain Score: 0-No pain/10  In general this is a well appearing African-American male in no acute distress.  He's alert and oriented x4 and appropriate throughout the examination. Cardiopulmonary assessment is negative for acute distress and he exhibits normal effort.     ECOG = 1  0 - Asymptomatic (Fully active, able to carry on all predisease activities without restriction)  1 - Symptomatic but completely ambulatory (Restricted in physically strenuous activity but ambulatory and able to carry out work of a light or sedentary nature. For example, light housework, office work)  2 - Symptomatic, <50% in bed during the day (Ambulatory and capable of all self care but unable to carry out any work activities. Up and about more than 50% of waking hours)  3 - Symptomatic, >50% in bed, but not bedbound (Capable of only limited self-care, confined to bed or chair 50% or more of waking hours)  4 - Bedbound (Completely disabled. Cannot carry on any self-care. Totally confined to bed or  chair)  5 - Death   Santiago Glad MM, Creech RH, Tormey DC, et al. [phone]). "Toxicity and response criteria of the [facility]". Am. Evlyn Clines. Oncol. 5 (6): 649-55    LABORATORY DATA:  Lab Results  Component Value Date   WBC 8.1 03/20/2023   HGB 13.9 03/20/2023   HCT 42.2 03/20/2023   MCV 94.8 03/20/2023   PLT 316.0 03/20/2023   Lab Results  Component Value Date   NA 142 03/20/2023   K 4.2 03/20/2023   CL 103 03/20/2023   CO2 32 03/20/2023   Lab Results  Component Value Date   ALT 10 03/20/2023   AST 18 03/20/2023   ALKPHOS 79 03/20/2023   BILITOT 0.6 03/20/2023      RADIOGRAPHY: DG Chest Port 1 View  Result Date: 03/11/2023 CLINICAL DATA:  Status post bronchoscopy. EXAM: PORTABLE CHEST  1 VIEW COMPARISON:  PET-01/24/2023. Chest CT 01/02/2023. Chest radiographs 11/22/2021. FINDINGS: The cardiomediastinal silhouette is unchanged with normal heart size. Aortic atherosclerosis is noted. A new fiducial projects lateral to the left hilum. Minor left basilar opacity likely reflects atelectasis. No sizable pleural effusion or pneumothorax is identified. No acute osseous abnormality is seen. IMPRESSION: 1. No pneumothorax. 2. Minor left basilar atelectasis. Electronically Signed   By: Sebastian Ache M.D.   On: 03/11/2023 16:08   DG C-ARM BRONCHOSCOPY  Result Date: 03/11/2023 C-ARM BRONCHOSCOPY: Fluoroscopy was utilized by the requesting physician.  No radiographic interpretation.       IMPRESSION/PLAN: 1. Stage IA2, cT1bN0M0, NSCLC, of the lingular LUL. Dr. Mitzi Hansen discusses the pathology findings and reviews the nature of early-stage lung cancer.  Dr. Mitzi Hansen reviews that the standard of care is for surgical resection. However for patients who are not medical candidates to undergo surgery, or who choose to forgo surgery, stereotactic body radiotherapy (SBRT) is an appropriate alternative.  We also discussed that the findings on his gallbladder could also influence how  aggressive to locally be for the lung and medical oncology will follow-up with him to discuss the MRI scan and then determine about referral for surgical consultation.  We reviewed stereotactic body radiotherapy however today so that he had a good understanding of what his options would be for radiation if he was not a surgical candidate.  We discussed the risks, benefits, short, and long term effects of radiotherapy, as well as the curative intent.  If the patient were to proceed the course of treatment would be 3-5 fractions of SBRT style therapy.  We will revisit next steps after his upcoming appointment with medical oncology 2. Fullness and concerns of hypermetabolism within the gallbladder fundus.  Dr. Mitzi Hansen and I reviewed the findings from the patient's imaging.  He has not had any upper GI symptoms, and this will be followed closely on his upcoming read of his MRI report.  We will follow this closely.   In a visit lasting 60 minutes, greater than 50% of the time was spent face to face discussing the patient's condition, in preparation for the discussion, and coordinating the patient's care.   The above documentation reflects my direct findings during this shared patient visit. Please see the separate note by Dr. Mitzi Hansen on this date for the remainder of the patient's plan of care.    Osker Mason, [facility]   **Disclaimer: This note was dictated with voice recognition software. Similar sounding words can inadvertently be transcribed and this note may contain transcription errors which may not have been corrected upon publication of note.**

## 2023-03-25 NOTE — Progress Notes (Signed)
Thoracic Location of Tumor / Histology: Left Upper Lobe Lung  Patient has been followed in the lung cancer screening program and was noted to have a 13.4 mm lesion that was spiculated in the lingular aspect of the left upper lobe on CT imaging on 01/02/2023.   Biopsies of LUL Lung Nodule 03/11/2023   Past/Anticipated interventions by cardiothoracic surgery, if any:   Past/Anticipated interventions by medical oncology, if any:    Tobacco/Marijuana/Snuff/ETOH use: Current Smoker  Signs/Symptoms Weight changes, if any: He reports a few pounds lost, weight fluctuates. Respiratory complaints, if any: He reports some SOB when walking longer distances.  Denies respiratory changes with normal daily activities. Hemoptysis, if any: Denies. Pain issues, if any:  Denies chest pain, pressure, or tightness.  SAFETY ISSUES: Prior radiation? No Pacemaker/ICD? No  Possible current pregnancy? N/a Is the patient on methotrexate? No   Current Complaints / other details:

## 2023-03-26 ENCOUNTER — Ambulatory Visit (HOSPITAL_COMMUNITY)
Admission: RE | Admit: 2023-03-26 | Discharge: 2023-03-26 | Disposition: A | Discharge status: Home / Self Care | Payer: Medicare Other | Source: Ambulatory Visit | Attending: Acute Care | Admitting: Acute Care

## 2023-03-26 ENCOUNTER — Other Ambulatory Visit: Payer: Self-pay | Admitting: Acute Care

## 2023-03-26 DIAGNOSIS — K8020 Calculus of gallbladder without cholecystitis without obstruction: Secondary | ICD-10-CM | POA: Diagnosis not present

## 2023-03-26 DIAGNOSIS — R932 Abnormal findings on diagnostic imaging of liver and biliary tract: Secondary | ICD-10-CM

## 2023-03-26 DIAGNOSIS — K838 Other specified diseases of biliary tract: Secondary | ICD-10-CM | POA: Diagnosis not present

## 2023-03-26 DIAGNOSIS — I700 Atherosclerosis of aorta: Secondary | ICD-10-CM | POA: Diagnosis not present

## 2023-03-26 MED ORDER — GADOBUTROL 1 MMOL/ML IV SOLN
7.0000 mL | Freq: Once | INTRAVENOUS | Status: AC | PRN
Start: 2023-03-26 — End: 2023-03-26
  Administered 2023-03-26: 7 mL via INTRAVENOUS

## 2023-03-28 ENCOUNTER — Encounter: Payer: Self-pay | Admitting: Radiation Oncology

## 2023-03-28 ENCOUNTER — Ambulatory Visit
Admission: RE | Admit: 2023-03-28 | Discharge: 2023-03-28 | Disposition: A | Discharge status: Home / Self Care | Payer: Medicare Other | Source: Ambulatory Visit | Attending: Radiation Oncology | Admitting: Radiation Oncology

## 2023-03-28 VITALS — BP 123/64 | HR 86 | Temp 97.8°F | Resp 16 | Ht 69.5 in | Wt 148.2 lb

## 2023-03-28 VITALS — BP 123/64 | HR 86 | Temp 97.8°F | Ht 69.5 in | Wt 148.2 lb

## 2023-03-28 DIAGNOSIS — K219 Gastro-esophageal reflux disease without esophagitis: Secondary | ICD-10-CM | POA: Insufficient documentation

## 2023-03-28 DIAGNOSIS — R011 Cardiac murmur, unspecified: Secondary | ICD-10-CM | POA: Diagnosis not present

## 2023-03-28 DIAGNOSIS — J449 Chronic obstructive pulmonary disease, unspecified: Secondary | ICD-10-CM | POA: Insufficient documentation

## 2023-03-28 DIAGNOSIS — C3412 Malignant neoplasm of upper lobe, left bronchus or lung: Secondary | ICD-10-CM | POA: Insufficient documentation

## 2023-03-28 DIAGNOSIS — F17210 Nicotine dependence, cigarettes, uncomplicated: Secondary | ICD-10-CM | POA: Diagnosis not present

## 2023-03-28 DIAGNOSIS — Z7951 Long term (current) use of inhaled steroids: Secondary | ICD-10-CM | POA: Diagnosis not present

## 2023-03-28 DIAGNOSIS — I700 Atherosclerosis of aorta: Secondary | ICD-10-CM | POA: Insufficient documentation

## 2023-03-28 HISTORY — DX: Malignant neoplasm of unspecified part of unspecified bronchus or lung: C34.90

## 2023-04-01 ENCOUNTER — Other Ambulatory Visit: Payer: Self-pay | Admitting: Acute Care

## 2023-04-01 DIAGNOSIS — C3490 Malignant neoplasm of unspecified part of unspecified bronchus or lung: Secondary | ICD-10-CM

## 2023-04-08 ENCOUNTER — Other Ambulatory Visit: Payer: Self-pay | Admitting: Physician Assistant

## 2023-04-08 DIAGNOSIS — C3412 Malignant neoplasm of upper lobe, left bronchus or lung: Secondary | ICD-10-CM

## 2023-04-08 NOTE — Progress Notes (Unsigned)
[facility] Telephone:([phone]) [phone]   Fax:(336) [phone]  CONSULT NOTE  REFERRING PHYSICIAN: Kandice Robinsons   REASON FOR CONSULTATION:  Non-Small Cell Lung Cancer, adenocarcinoma   HPI Brandon Mckinney is a 67 y.o. male with a past medical history significant for emphysema/COPD, GERD, thyroid nodule, and atherosclerosis is referred to the clinic for newly diagnosed non-small cell lung cancer, adenocarcinoma.  The patient is enrolled in the lung cancer screening program and had a low-dose lung cancer screening CT scan on 01/02/2023 that showed a 13.8 mm spiculated lingular nodule highly suspicious for bronchogenic carcinoma.  The patient subsequently had a PET scan on 01/24/2023 that showed  That the irregular lingular nodule was hypermetabolic with an SUV max of 8.3 compatible with bronchogenic carcinoma. The scan also showed abnormal soft tissue along the gallbladder fundus raising concern for early gallbladder carcinoma.  The patient had an MRI of the abdomen to further assess this on 03/26/2023 which did not show any signal or contrast-enhancing abnormality in the liver or in the anterior aspect of the gallbladder fossa corresponding to the abnormality seen on PET/CT.   Dr. Delton Coombes saw the patient and arranged bronchoscopy and biopsy on 03/11/23. The final pathology of the LUL target ([id number]) showed non-small cell lung cancer. IHC was most consistent with adenocarcioma.   The patient was referred to radiation oncology and cardiothoracic surgery.  He saw radiation oncology on 03/28/2023.  If the patient is not going to undergo surgical resection, then patient would be a candidate for SBRT.  He is scheduled to see cardiothoracic surgery tomorrow on 04/11/2023. He is scheduled to see Dr. Dorris Fetch. The most recent PFTs are from 2021. It looks like the patient was already previously counseled on smoking cessation.  Overall, the patient is feeling "pretty good" today.  He denies  any fever, chills, night sweats, or unexplained weight loss.  Denies any nausea, vomiting, diarrhea, or constipation.  Denies any chest pain, cough, or hemoptysis. He does report baseline dyspnea on exertion. Denies any headache or visual changes.   His family history consist of a grandfather who had colorectal cancer.  The patient's father had bone cancer.  The patient's brother had prostate cancer.  The patient worked as an Network engineer.  He is married.  He is to adopted children.  He is a current smoker having smoked approximately less than 1 pack of cigarettes per day for the last 30 years or so.  He only drinks alcohol for special occasions such as around the holidays.    HPI  Past Medical History:  Diagnosis Date   COPD (chronic obstructive pulmonary disease) (HCC)    GERD (gastroesophageal reflux disease)    Heart murmur    per patient no one MD has said anything about in years   Lung cancer [facility])     Past Surgical History:  Procedure Laterality Date   BRONCHIAL BIOPSY  03/11/2023   Procedure: BRONCHIAL BIOPSIES;  Surgeon: Leslye Peer, MD;  Location: [facility] ENDOSCOPY;  Service: Pulmonary;;   BRONCHIAL BRUSHINGS  03/11/2023   Procedure: BRONCHIAL BRUSHINGS;  Surgeon: Leslye Peer, MD;  Location: [facility] ENDOSCOPY;  Service: Pulmonary;;   BRONCHIAL NEEDLE ASPIRATION BIOPSY  03/11/2023   Procedure: BRONCHIAL NEEDLE ASPIRATION BIOPSIES;  Surgeon: Leslye Peer, MD;  Location: [facility] ENDOSCOPY;  Service: Pulmonary;;   FIDUCIAL MARKER PLACEMENT  03/11/2023   Procedure: FIDUCIAL MARKER PLACEMENT;  Surgeon: Leslye Peer, MD;  Location: [facility] ENDOSCOPY;  Service: Pulmonary;;   PENECTOMY  No family history on file.  Social History Social History   Tobacco Use   Smoking status: Every Day    Current packs/day: 1.00    Average packs/day: 1 pack/day for 48.9 years (48.9 ttl pk-yrs)    Types: Cigarettes    Start date: 1976   Smokeless tobacco: Never   Tobacco comments:    Less  then 1pk daily/07/11/22  Vaping Use   Vaping status: Never Used  Substance Use Topics   Alcohol use: Never   Drug use: Never    No Known Allergies  Current Outpatient Medications  Medication Sig Dispense Refill   albuterol (VENTOLIN HFA) 108 (90 Base) MCG/ACT inhaler Inhale 1 puff into the lungs every 6 (six) hours as needed for wheezing or shortness of breath. 24 g 3   budesonide-formoterol (SYMBICORT) 160-4.5 MCG/ACT inhaler Inhale 2 puffs into the lungs in the morning and at bedtime. 3 each 3   omeprazole (PRILOSEC) 40 MG capsule Take 1 capsule (40 mg total) by mouth daily. 90 capsule 3   Tiotropium Bromide Monohydrate (SPIRIVA RESPIMAT) 2.5 MCG/ACT AERS Inhale 2 puffs into the lungs daily. 4 g 6   Varenicline Tartrate, Starter, (CHANTIX STARTING MONTH PAK) 0.5 MG X 11 & 1 MG X 42 TBPK Take 1 tablet by mouth daily. 1 each 0   No current facility-administered medications for this visit.    REVIEW OF SYSTEMS:   Review of Systems  Constitutional: Negative for appetite change, chills, fatigue, fever and unexpected weight change.  HENT:   Negative for mouth sores, nosebleeds, sore throat and trouble swallowing.   Eyes: Negative for eye problems and icterus.  Respiratory: Positive for baseline dyspnea on exertion. Negative for cough, hemoptysis, and wheezing.   Cardiovascular: Negative for chest pain and leg swelling.  Gastrointestinal: Negative for abdominal pain, constipation, diarrhea, nausea and vomiting.  Genitourinary: Negative for bladder incontinence, difficulty urinating, dysuria, frequency and hematuria.   Musculoskeletal: Negative for back pain, gait problem, neck pain and neck stiffness.  Skin: Negative for itching and rash.  Neurological: Negative for dizziness, extremity weakness, gait problem, headaches, light-headedness and seizures.  Hematological: Negative for adenopathy. Does not bruise/bleed easily.  Psychiatric/Behavioral: Negative for confusion, depression and sleep  disturbance. The patient is not nervous/anxious.     PHYSICAL EXAMINATION:  Blood pressure 128/79, pulse 97, temperature (!) 97.3 F (36.3 C), temperature source Oral, resp. rate 18, height 5' 9.5" (1.765 m), weight 149 lb 9 oz (67.8 kg), SpO2 98%.  ECOG PERFORMANCE STATUS: 0  Physical Exam  Constitutional: Oriented to person, place, and time and well-developed, well-nourished, and in no distress.  HENT:  Head: Normocephalic and atraumatic.  Mouth/Throat: Oropharynx is clear and moist. No oropharyngeal exudate.  Eyes: Conjunctivae are normal. Right eye exhibits no discharge. Left eye exhibits no discharge. No scleral icterus.  Neck: Normal range of motion. Neck supple.  Cardiovascular: Normal rate, regular rhythm, normal heart sounds and intact distal pulses.   Pulmonary/Chest: Effort normal. Quiet breath sounds bilaterally. No respiratory distress. No wheezes. No rales.  Abdominal: Soft. Bowel sounds are normal. Exhibits no distension and no mass. There is no tenderness.  Musculoskeletal: Normal range of motion. Exhibits no edema.  Lymphadenopathy:    No cervical adenopathy.  Neurological: Alert and oriented to person, place, and time. Exhibits normal muscle tone. Gait normal. Coordination normal.  Skin: Skin is warm and dry. No rash noted. Not diaphoretic. No erythema. No pallor.  Psychiatric: Mood, memory and judgment normal.  Vitals reviewed.  LABORATORY DATA:  Lab Results  Component Value Date   WBC 8.1 03/20/2023   HGB 13.9 03/20/2023   HCT 42.2 03/20/2023   MCV 94.8 03/20/2023   PLT 316.0 03/20/2023      Chemistry      Component Value Date/Time   NA 142 03/20/2023 1111   K 4.2 03/20/2023 1111   CL 103 03/20/2023 1111   CO2 32 03/20/2023 1111   BUN 11 03/20/2023 1111   CREATININE 1.04 03/20/2023 1111      Component Value Date/Time   CALCIUM 9.4 03/20/2023 1111   ALKPHOS 79 03/20/2023 1111   AST 18 03/20/2023 1111   ALT 10 03/20/2023 1111   BILITOT 0.6  03/20/2023 1111       RADIOGRAPHIC STUDIES: MR ABDOMEN WWO CONTRAST  Result Date: 03/31/2023 CLINICAL DATA:  Abnormal appearance of the gallbladder on prior PET-CT lung cancer * Tracking Code: BO * EXAM: MRI ABDOMEN WITHOUT AND WITH CONTRAST TECHNIQUE: Multiplanar multisequence MR imaging of the abdomen was performed both before and after the administration of intravenous contrast. CONTRAST:  7mL GADAVIST GADOBUTROL 1 MMOL/ML IV SOLN COMPARISON:  PET-CT, 01/24/2023 FINDINGS: Lower chest: No acute abnormality. Hepatobiliary: No solid liver abnormality is seen. Specifically, no signal or contrast enhancement abnormality of the liver of the anterior aspect of the gallbladder fossa to correspond to abnormality of prior PET-CT (series 16, image 45). Numerous fluid signal cysts scattered throughout the liver, benign, for which no further follow-up or characterization is required. Gallstones (series 4, image 17). No gallbladder wall thickening. Common hepatic and common bile duct dilatation, measuring up to 1.1 cm in caliber, tapering smoothly to the ampulla without calculus or other obstruction (series 3, image 13). Pancreas: Unremarkable. No pancreatic ductal dilatation or surrounding inflammatory changes. Spleen: Normal in size without significant abnormality. Adrenals/Urinary Tract: Adrenal glands are unremarkable. Simple fluid signal renal cortical cysts, for which no further follow-up or characterization is required. Kidneys are otherwise normal, without renal calculi, solid lesion, or hydronephrosis. Stomach/Bowel: Stomach is within normal limits. No evidence of bowel wall thickening, distention, or inflammatory changes. Vascular/Lymphatic: Aortic atherosclerosis. No enlarged abdominal lymph nodes. Other: No abdominal wall hernia or abnormality. No ascites. Musculoskeletal: No acute or significant osseous findings. IMPRESSION: 1. No solid liver abnormality is seen. Specifically, no signal or contrast  enhancement abnormality of the liver of the anterior aspect of the gallbladder fossa to correspond to abnormality of prior PET-CT. 2. Cholelithiasis without evidence of acute cholecystitis. 3. Common hepatic and common bile duct dilatation, measuring up to 1.1 cm in caliber, tapering smoothly to the ampulla without calculus or other obstruction. This is of doubtful significance in the absence clinical or biochemical evidence of cholestasis. Electronically Signed   By: Jearld Lesch M.D.   On: 03/31/2023 22:26   MR 3D Recon At Scanner  Result Date: 03/31/2023 CLINICAL DATA:  Abnormal appearance of the gallbladder on prior PET-CT lung cancer * Tracking Code: BO * EXAM: MRI ABDOMEN WITHOUT AND WITH CONTRAST TECHNIQUE: Multiplanar multisequence MR imaging of the abdomen was performed both before and after the administration of intravenous contrast. CONTRAST:  7mL GADAVIST GADOBUTROL 1 MMOL/ML IV SOLN COMPARISON:  PET-CT, 01/24/2023 FINDINGS: Lower chest: No acute abnormality. Hepatobiliary: No solid liver abnormality is seen. Specifically, no signal or contrast enhancement abnormality of the liver of the anterior aspect of the gallbladder fossa to correspond to abnormality of prior PET-CT (series 16, image 45). Numerous fluid signal cysts scattered throughout the liver, benign, for which no further follow-up or characterization is  required. Gallstones (series 4, image 17). No gallbladder wall thickening. Common hepatic and common bile duct dilatation, measuring up to 1.1 cm in caliber, tapering smoothly to the ampulla without calculus or other obstruction (series 3, image 13). Pancreas: Unremarkable. No pancreatic ductal dilatation or surrounding inflammatory changes. Spleen: Normal in size without significant abnormality. Adrenals/Urinary Tract: Adrenal glands are unremarkable. Simple fluid signal renal cortical cysts, for which no further follow-up or characterization is required. Kidneys are otherwise normal,  without renal calculi, solid lesion, or hydronephrosis. Stomach/Bowel: Stomach is within normal limits. No evidence of bowel wall thickening, distention, or inflammatory changes. Vascular/Lymphatic: Aortic atherosclerosis. No enlarged abdominal lymph nodes. Other: No abdominal wall hernia or abnormality. No ascites. Musculoskeletal: No acute or significant osseous findings. IMPRESSION: 1. No solid liver abnormality is seen. Specifically, no signal or contrast enhancement abnormality of the liver of the anterior aspect of the gallbladder fossa to correspond to abnormality of prior PET-CT. 2. Cholelithiasis without evidence of acute cholecystitis. 3. Common hepatic and common bile duct dilatation, measuring up to 1.1 cm in caliber, tapering smoothly to the ampulla without calculus or other obstruction. This is of doubtful significance in the absence clinical or biochemical evidence of cholestasis. Electronically Signed   By: Jearld Lesch M.D.   On: 03/31/2023 22:26    ASSESSMENT: This is a very pleasant 67 year old African-American male referred to the clinic for stage I non-small cell lung cancer, adenocarcinoma.  He presented with a 13.8 mm irregular lingular nodule.  He was diagnosed in November 2024.  The patient was seen with Dr. Arbutus Ped today.  Dr. Arbutus Ped discussed the options for stage I non-small cell lung cancer include surgery versus SBRT.  The patient is scheduled to be evaluated by Dr. Dorris Fetch from cardiothoracic surgery in 04/11/2023.  If the patient is a surgical candidate, Dr. Arbutus Ped encouraged the patient to consider surgery.  If not, then he would recommend SBRT.  We recommend seeing the patient back about 1 month after surgery if he is a candidate.   The patient was strongly encouraged to quit smoking.   The patient has not had PFTs since 2021.  I have placed an order for PFTs.  I am unsure if the patient will be able to get PFTs prior to his evaluation with Dr. Dorris Fetch tomorrow  but we will call over to respiratory therapy and see when the earliest available is.  Of note the patient's initial staging PET scan showed a suspected soft tissue abnormality along the gallbladder fundus raising concern for gallbladder carcinoma.  With an SUV max of 10.6.  He had an MRI to further evaluate this which was negative.    The patient voices understanding of current disease status and treatment options and is in agreement with the current care plan.  All questions were answered. The patient knows to call the clinic with any problems, questions or concerns. We can certainly see the patient much sooner if necessary.  Thank you so much for allowing me to participate in the care of [employer] I will continue to follow up the patient with you and assist in his care.  Disclaimer: This note was dictated with voice recognition software. Similar sounding words can inadvertently be transcribed and may not be corrected upon review.   Brandon Mckinney L Rorey Brandon Mckinney April 10, 2023, 2:20 PM  ADDENDUM: Hematology/Oncology Attending: I had a face-to-face encounter with the patient today.  I reviewed his record, lab, scan and recommended his care plan.  This is a very pleasant  67 years old African-American male with history of COPD, GERD, thyroid nodule as well as atherosclerosis that was involved with the lung cancer screening program and CT scan of the chest on January 02, 2023 showed a 1.38 cm spiculated lingular nodule highly suspicious for bronchogenic carcinoma.  A PET scan was performed on January 24, 2023 and showed hypermetabolic activity in the irregular lingular nodule suspicious for bronchogenic carcinoma but no evidence of lymph node involvement or metastatic disease. The patient underwent bronchoscopy with biopsy of the left upper lobe lung nodule under the care of Dr. Delton Coombes and the final pathology was consistent with non-small cell lung cancer, adenocarcinoma. When seen today the  patient is feeling fine with no concerning complaints except for mild fatigue. I had a lengthy discussion with the patient today about his condition.  He has a stage Ia (T1b, N0, M0) non-small cell lung cancer, adenocarcinoma presented with left lingular nodule. I explained to the patient that the best treatment option will be surgical resection if he is a good surgical candidate but if he is not for any reason, then SBRT would be a reasonable option for treatment of this pulmonary nodule. The patient already seen by radiation oncology and he is scheduled to see Dr. Dorris Fetch his thoracic surgeon tomorrow for discussion of the surgical option. We will see him back for follow-up visit after his surgery or radiation to discuss future plan for close monitoring and observation. The patient was strongly advised to quit smoking. He was also advised to call immediately if he has any other concerning symptoms in the interval. The total time spent in the appointment was 60 minutes. Disclaimer: This note was dictated with voice recognition software. Similar sounding words can inadvertently be transcribed and may be missed upon review. Lajuana Matte, MD

## 2023-04-10 ENCOUNTER — Inpatient Hospital Stay: Payer: Medicare Other | Attending: Physician Assistant | Admitting: Physician Assistant

## 2023-04-10 ENCOUNTER — Inpatient Hospital Stay: Payer: Medicare Other

## 2023-04-10 VITALS — BP 128/79 | HR 97 | Temp 97.3°F | Resp 18 | Ht 69.5 in | Wt 149.6 lb

## 2023-04-10 DIAGNOSIS — Z79899 Other long term (current) drug therapy: Secondary | ICD-10-CM | POA: Insufficient documentation

## 2023-04-10 DIAGNOSIS — C3412 Malignant neoplasm of upper lobe, left bronchus or lung: Secondary | ICD-10-CM

## 2023-04-10 DIAGNOSIS — Z800 Family history of malignant neoplasm of digestive organs: Secondary | ICD-10-CM | POA: Insufficient documentation

## 2023-04-10 DIAGNOSIS — F17210 Nicotine dependence, cigarettes, uncomplicated: Secondary | ICD-10-CM | POA: Diagnosis not present

## 2023-04-10 LAB — CMP (CANCER CENTER ONLY)
ALT: 12 U/L (ref 0–44)
AST: 20 U/L (ref 15–41)
Albumin: 4.2 g/dL (ref 3.5–5.0)
Alkaline Phosphatase: 86 U/L (ref 38–126)
Anion gap: 5 (ref 5–15)
BUN: 11 mg/dL (ref 8–23)
CO2: 29 mmol/L (ref 22–32)
Calcium: 9.3 mg/dL (ref 8.9–10.3)
Chloride: 105 mmol/L (ref 98–111)
Creatinine: 0.93 mg/dL (ref 0.61–1.24)
GFR, Estimated: >60 mL/min (ref >60)
Glucose, Bld: 82 mg/dL (ref 70–99)
Potassium: 4 mmol/L (ref 3.5–5.1)
Sodium: 139 mmol/L (ref 135–145)
Total Bilirubin: 0.5 mg/dL (ref <1.2)
Total Protein: 7.6 g/dL (ref 6.5–8.1)

## 2023-04-10 LAB — CBC WITH DIFFERENTIAL (CANCER CENTER ONLY)
Abs Immature Granulocytes: 0.02 10*3/uL (ref 0.00–0.07)
Basophils Absolute: 0.1 10*3/uL (ref 0.0–0.1)
Basophils Relative: 1 %
Eosinophils Absolute: 0.1 10*3/uL (ref 0.0–0.5)
Eosinophils Relative: 1 %
HCT: 42.1 % (ref 39.0–52.0)
Hemoglobin: 14.4 g/dL (ref 13.0–17.0)
Immature Granulocytes: 0 %
Lymphocytes Relative: 40 %
Lymphs Abs: 3.1 10*3/uL (ref 0.7–4.0)
MCH: 31.5 pg (ref 26.0–34.0)
MCHC: 34.2 g/dL (ref 30.0–36.0)
MCV: 92.1 fL (ref 80.0–100.0)
Monocytes Absolute: 0.6 10*3/uL (ref 0.1–1.0)
Monocytes Relative: 7 %
Neutro Abs: 4.1 10*3/uL (ref 1.7–7.7)
Neutrophils Relative %: 51 %
Platelet Count: 296 10*3/uL (ref 150–400)
RBC: 4.57 MIL/uL (ref 4.22–5.81)
RDW: 14.4 % (ref 11.5–15.5)
WBC Count: 7.9 10*3/uL (ref 4.0–10.5)
nRBC: 0 % (ref 0.0–0.2)

## 2023-04-10 NOTE — Progress Notes (Signed)
I met with this pt face to face today around 2pm. I provided him with my business card and explained my role as a nurse navigator in the context of his cancer care. I let the pt know that PFTs are needed as part of his work up for CT surgery. I let the pt know that I would reach out to respiratory therapy at [facility] to schedule them for him. Pt is not currently limited by day or time. Pt had no questions upon the conclusion of his appt. I checked the pt in for his lab appt and escorted him to the lab.  At 3:01 I sent an email to [facility] RTs Ruthell Rummage and Lorinda Creed to schedule the pts PFTs.  3:03 I received a call from Ruthell Rummage letting me know that she could see him tomorrow at 9:30 for PFTs prior to his appt with CT surg at 11:45 3:08: I called the pt to let him know that he was scheduled for PFTs at 9:30 at [facility]. I instructed him to go through entrance A to park and then go to admitting by 9:15 so they can check him in. I also instructed him that he is not to have any caffeine, not to smoke, and not to take any of his morning breathing medications as they may skew the results of his PFTs. Pt verbalized understanding of all the instructions I provided him.

## 2023-04-11 ENCOUNTER — Institutional Professional Consult (permissible substitution): Payer: Medicare Other | Admitting: Thoracic Surgery (Cardiothoracic Vascular Surgery)

## 2023-04-11 ENCOUNTER — Other Ambulatory Visit: Payer: Self-pay | Admitting: Pulmonary Disease

## 2023-04-11 ENCOUNTER — Ambulatory Visit (HOSPITAL_COMMUNITY)
Admission: RE | Admit: 2023-04-11 | Discharge: 2023-04-11 | Disposition: A | Discharge status: Home / Self Care | Payer: Medicare Other | Source: Ambulatory Visit | Attending: Physician Assistant | Admitting: Physician Assistant

## 2023-04-11 ENCOUNTER — Telehealth: Payer: Self-pay | Admitting: Radiation Oncology

## 2023-04-11 ENCOUNTER — Encounter: Payer: Self-pay | Admitting: Thoracic Surgery (Cardiothoracic Vascular Surgery)

## 2023-04-11 VITALS — BP 130/67 | HR 96 | Resp 20 | Ht 69.5 in | Wt 147.0 lb

## 2023-04-11 DIAGNOSIS — C3412 Malignant neoplasm of upper lobe, left bronchus or lung: Secondary | ICD-10-CM | POA: Diagnosis not present

## 2023-04-11 LAB — PULMONARY FUNCTION TEST
DL/VA % pred: 44 %
DL/VA: 1.83 ml/min/mmHg/L
DLCO cor % pred: 43 %
DLCO cor: 11.14 ml/min/mmHg
DLCO unc % pred: 43 %
DLCO unc: 11.08 ml/min/mmHg
FEF 25-75 Post: 0.63 L/s
FEF 25-75 Pre: 0.36 L/s
FEF2575-%Change-Post: 75 %
FEF2575-%Pred-Post: 25 %
FEF2575-%Pred-Pre: 14 %
FEV1-%Change-Post: 26 %
FEV1-%Pred-Post: 46 %
FEV1-%Pred-Pre: 36 %
FEV1-Post: 1.5 L
FEV1-Pre: 1.19 L
FEV1FVC-%Change-Post: 5 %
FEV1FVC-%Pred-Pre: 50 %
FEV6-%Change-Post: 20 %
FEV6-%Pred-Post: 74 %
FEV6-%Pred-Pre: 61 %
FEV6-Post: 3.07 L
FEV6-Pre: 2.56 L
FEV6FVC-%Change-Post: 0 %
FEV6FVC-%Pred-Post: 85 %
FEV6FVC-%Pred-Pre: 85 %
FVC-%Change-Post: 19 %
FVC-%Pred-Post: 86 %
FVC-%Pred-Pre: 72 %
FVC-Post: 3.8 L
FVC-Pre: 3.17 L
Post FEV1/FVC ratio: 39 %
Post FEV6/FVC ratio: 81 %
Pre FEV1/FVC ratio: 38 %
Pre FEV6/FVC Ratio: 81 %
RV % pred: 199 %
RV: 4.79 L
TLC % pred: 118 %
TLC: 8.2 L

## 2023-04-11 MED ORDER — ALBUTEROL SULFATE (2.5 MG/3ML) 0.083% IN NEBU
2.5000 mg | INHALATION_SOLUTION | Freq: Once | RESPIRATORY_TRACT | Status: AC
  Administered 2023-04-11: 2.5 mg via RESPIRATORY_TRACT

## 2023-04-11 NOTE — Progress Notes (Signed)
PCP is Georgina Quint, MD Referring Provider is Bevelyn Ngo, NP  Chief Complaint  Patient presents with   Lung Cancer    Bronch 03/11/23/ PET Scan 01/24/23/ Chest CT 01/02/23/ PFT's 04/11/23    HPI: Brandon Mckinney is sent for consultation regarding an adenocarcinoma of the left upper lobe.  Brandon Mckinney is a 67 year old man with a history of ongoing tobacco abuse, COPD, reflux, heart cancer, and recently diagnosed adenocarcinoma of the lung.  He has smoked less than a half a pack a day for almost 50 years.  Continues to smoke the same amount he always has.  He has had issues with COPD which was first diagnosed in 2015.    He has been in the lung cancer screening program since 2022.  Recently he had a scan which showed a new 1.3 cm spiculated nodule in the left upper lobe.  On PET/CT the nodule was hypermetabolic.  Dr. Delton Coombes did a robotic bronchoscopy which showed adenocarcinoma.  No evidence of regional or distant metastatic disease by PET.  There was an unusual appearing area of hypermetabolic activity in the area of the liver/gallbladder.  MRI of the abdomen did not show any definitive abnormality in that area.  He saw a radiation oncology a couple of weeks ago was felt to be a candidate for SBRT.  He denies any chest pain, pressure, tightness.  Short of breath with heavy activity but not with routine activities.  He can walk up a flight of stairs.  Uses scheduled inhalers for wheezing but rarely has to use a rescue inhaler.  He is lost about 13 pounds but is unclear over what timeframe that occurred.  He is a Scientist, product/process development  Past Medical History:  Diagnosis Date   COPD (chronic obstructive pulmonary disease) (HCC)    GERD (gastroesophageal reflux disease)    Heart murmur    per patient no one MD has said anything about in years   Lung cancer [facility])     Past Surgical History:  Procedure Laterality Date   BRONCHIAL BIOPSY  03/11/2023   Procedure: BRONCHIAL BIOPSIES;  Surgeon: Leslye Peer, MD;  Location: [facility] ENDOSCOPY;  Service: Pulmonary;;   BRONCHIAL BRUSHINGS  03/11/2023   Procedure: BRONCHIAL BRUSHINGS;  Surgeon: Leslye Peer, MD;  Location: [facility] ENDOSCOPY;  Service: Pulmonary;;   BRONCHIAL NEEDLE ASPIRATION BIOPSY  03/11/2023   Procedure: BRONCHIAL NEEDLE ASPIRATION BIOPSIES;  Surgeon: Leslye Peer, MD;  Location: [facility] ENDOSCOPY;  Service: Pulmonary;;   FIDUCIAL MARKER PLACEMENT  03/11/2023   Procedure: FIDUCIAL MARKER PLACEMENT;  Surgeon: Leslye Peer, MD;  Location: [facility] ENDOSCOPY;  Service: Pulmonary;;   PENECTOMY      History reviewed. No pertinent family history.  Social History Social History   Tobacco Use   Smoking status: Every Day    Current packs/day: 1.00    Average packs/day: 1 pack/day for 48.9 years (48.9 ttl pk-yrs)    Types: Cigarettes    Start date: 1976   Smokeless tobacco: Never   Tobacco comments:    Less then 1pk daily/07/11/22  Vaping Use   Vaping status: Never Used  Substance Use Topics   Alcohol use: Never   Drug use: Never    Current Outpatient Medications  Medication Sig Dispense Refill   albuterol (VENTOLIN HFA) 108 (90 Base) MCG/ACT inhaler Inhale 1 puff into the lungs every 6 (six) hours as needed for wheezing or shortness of breath. 24 g 3   budesonide-formoterol (SYMBICORT) 160-4.5 MCG/ACT inhaler  Inhale 2 puffs into the lungs in the morning and at bedtime. 3 each 3   omeprazole (PRILOSEC) 40 MG capsule Take 1 capsule (40 mg total) by mouth daily. 90 capsule 3   Tiotropium Bromide Monohydrate (SPIRIVA RESPIMAT) 2.5 MCG/ACT AERS Inhale 2 puffs into the lungs daily. 4 g 6   Varenicline Tartrate, Starter, (CHANTIX STARTING MONTH PAK) 0.5 MG X 11 & 1 MG X 42 TBPK Take 1 tablet by mouth daily. 1 each 0   No current facility-administered medications for this visit.    No Known Allergies  Review of Systems  Constitutional:  Positive for unexpected weight change (Has lost 13 pounds). Negative for activity change and  fatigue.  HENT:  Negative for trouble swallowing and voice change.   Respiratory:  Positive for wheezing. Negative for cough and shortness of breath.   Cardiovascular:  Negative for chest pain.  All other systems reviewed and are negative.   BP 130/67 (BP Location: Left Arm, Patient Position: Sitting, Cuff Size: Normal)   Pulse 96   Resp 20   Ht 5' 9.5" (1.765 m)   Wt 147 lb (66.7 kg)   SpO2 94% Comment: RA  BMI 21.40 kg/m  Physical Exam Vitals reviewed.  Constitutional:      General: He is not in acute distress.    Appearance: Normal appearance.  HENT:     Head: Normocephalic and atraumatic.  Eyes:     General: No scleral icterus.    Extraocular Movements: Extraocular movements intact.  Cardiovascular:     Rate and Rhythm: Normal rate and regular rhythm.     Heart sounds: Normal heart sounds. No murmur heard. Pulmonary:     Effort: Pulmonary effort is normal.     Breath sounds: No wheezing or rales.     Comments: Diminished breath sounds bilaterally Musculoskeletal:     Cervical back: Neck supple.     Comments: Clubbing  Lymphadenopathy:     Cervical: No cervical adenopathy.  Skin:    General: Skin is warm and dry.  Neurological:     General: No focal deficit present.     Mental Status: He is alert and oriented to person, place, and time.     Cranial Nerves: No cranial nerve deficit.     Motor: No weakness.     Diagnostic Tests: NUCLEAR MEDICINE PET SKULL BASE TO THIGH   TECHNIQUE: 7.5 mCi F-18 FDG was injected intravenously. Full-ring PET imaging was performed from the skull base to thigh after the radiotracer. CT data was obtained and used for attenuation correction and anatomic localization.   Fasting blood glucose: 82 mg/dl   COMPARISON:  Low-dose lung cancer screening CT chest in 01/02/2023   FINDINGS: Mediastinal blood pool activity: SUV max 2.1   Liver activity: SUV max NA   NECK: No hypermetabolic lymph nodes in the neck.   Incidental CT  findings: None.   CHEST: No hypermetabolic thoracic lymphadenopathy.   13 mm irregular lingular nodule (series 7/image 39), max SUV 8.3, compatible with primary bronchogenic carcinoma.   Incidental CT findings: Mild atherosclerotic calcifications of the aortic arch. Mild coronary atherosclerosis of the LAD.   ABDOMEN/PELVIS: No abnormal hypermetabolic activity within the liver, pancreas, adrenal glands, or spleen.   Suspected abnormal soft tissue along the gallbladder fundus (series 4/image 133), max SUV 10.6. Wall poorly evaluated on CT, this raises concern for early gallbladder carcinoma.   No hypermetabolic lymph nodes in the abdomen or pelvis.   Incidental CT findings: Hepatic cysts.  Atherosclerotic calcifications abdominal aorta and branch vessels. Prostatomegaly, suggesting BPH.   SKELETON: No focal hypermetabolic activity to suggest skeletal metastasis.   Incidental CT findings: None.   IMPRESSION: 13 mm irregular lingular nodule, compatible with primary bronchogenic carcinoma.   No evidence of metastatic disease.   Suspected abnormal soft tissue along the gallbladder fundus, raising concern for early gallbladder carcinoma. Abdominal MRI with/without contrast is suggested for further evaluation.     Electronically Signed   By: Charline Bills M.D.   On: 02/07/2023 02:21 I personally reviewed the PET/CT images.  There is a 13 mm central lingular nodule that is hypermetabolic.  No hypermetabolic activity in the mediastinal or hilar lymph nodes.  Hyperactivity in the gallbladder fossa.  Pulmonary function testing 04/11/2023 FVC 3.17 (72% of predicted FEV1 1.19 (36% of predicted) FEV1 1.50 (46% of predicted) postbronchodilator DLCO 11.14 (43% of predicted)  Impression: Brandon Mckinney is a 67 year old man with a history of ongoing tobacco abuse, COPD, reflux, heart cancer, and recently diagnosed adenocarcinoma of the lung.    Clinical stage Ia adenocarcinoma left  upper lobe-options for treatment include surgical resection and stereotactic radiation.  Possible we can get by with a lingular segmentectomy, but most likely would require left upper lobectomy given the central nature of the nodule.  His lung function is borderline to tolerate a lobectomy.  Particular concerned about the rate of progression of his COPD with his FEV1 dropping from 1.79-1 0.19 over 3-year.  Diffusion capacity is also dropped significantly from 51% to 43% of predicted.    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  I discussed the proposed operative procedure with him which would be to do a robotic assisted lingular segmentectomy or left upper lobectomy.  Again I think a left upper lobectomy would likely be needed.  We discussed the indications, risk, benefits, and alternatives.  After hearing the risk of surgery and the impact it might have on his respiratory status he does not want to pursue surgery and wishes to have radiation.  Plan: He will contact radiation oncology to schedule stereotactic radiation  Loreli Slot, MD [facility] [phone]

## 2023-04-11 NOTE — Telephone Encounter (Signed)
I called the patient and let him know Dr. Dorris Fetch let us know he was not a surgical candidate and would recommend he receive SBRT treatment. I had to leave a message though to let him know I'd ask our team to call to schedule simulation and we would ask him to sign consent at that time.

## 2023-04-11 NOTE — Telephone Encounter (Signed)
Patient would like to confirm results of bronch biopsy. Patient phone number is [phone].

## 2023-04-17 NOTE — Telephone Encounter (Signed)
Spoke with patient. States he wanted to confirm that another doctor in [facility] could see his results. Advised [facility] providers have access to his bronch biopsy. Patient advises he is moving forward with surgery.

## 2023-04-18 ENCOUNTER — Ambulatory Visit
Admission: RE | Admit: 2023-04-18 | Discharge: 2023-04-18 | Disposition: A | Discharge status: Home / Self Care | Payer: Medicare Other | Source: Ambulatory Visit | Attending: Radiation Oncology | Admitting: Radiation Oncology

## 2023-04-18 DIAGNOSIS — Z510 Encounter for antineoplastic radiation therapy: Secondary | ICD-10-CM | POA: Diagnosis not present

## 2023-04-18 DIAGNOSIS — C3412 Malignant neoplasm of upper lobe, left bronchus or lung: Secondary | ICD-10-CM | POA: Insufficient documentation

## 2023-04-18 DIAGNOSIS — F17210 Nicotine dependence, cigarettes, uncomplicated: Secondary | ICD-10-CM | POA: Diagnosis not present

## 2023-04-29 ENCOUNTER — Ambulatory Visit: Payer: Medicare Other | Admitting: Radiation Oncology

## 2023-04-29 DIAGNOSIS — F17210 Nicotine dependence, cigarettes, uncomplicated: Secondary | ICD-10-CM | POA: Diagnosis not present

## 2023-04-29 DIAGNOSIS — C3412 Malignant neoplasm of upper lobe, left bronchus or lung: Secondary | ICD-10-CM | POA: Diagnosis not present

## 2023-04-29 DIAGNOSIS — Z510 Encounter for antineoplastic radiation therapy: Secondary | ICD-10-CM | POA: Diagnosis not present

## 2023-04-30 ENCOUNTER — Ambulatory Visit: Payer: Medicare Other | Admitting: Radiation Oncology

## 2023-05-02 ENCOUNTER — Ambulatory Visit: Payer: Medicare Other | Admitting: Radiation Oncology

## 2023-05-02 ENCOUNTER — Ambulatory Visit
Admission: RE | Admit: 2023-05-02 | Discharge: 2023-05-02 | Disposition: A | Discharge status: Home / Self Care | Payer: Medicare Other | Source: Ambulatory Visit | Attending: Radiation Oncology | Admitting: Radiation Oncology

## 2023-05-02 ENCOUNTER — Other Ambulatory Visit: Payer: Self-pay

## 2023-05-02 DIAGNOSIS — C3412 Malignant neoplasm of upper lobe, left bronchus or lung: Secondary | ICD-10-CM | POA: Diagnosis not present

## 2023-05-02 DIAGNOSIS — Z510 Encounter for antineoplastic radiation therapy: Secondary | ICD-10-CM | POA: Diagnosis not present

## 2023-05-02 LAB — RAD ONC ARIA SESSION SUMMARY
Course Elapsed Days: 0
Plan Fractions Treated to Date: 1
Plan Prescribed Dose Per Fraction: 18 Gy
Plan Total Fractions Prescribed: 3
Plan Total Prescribed Dose: 54 Gy
Reference Point Dosage Given to Date: 18 Gy
Reference Point Session Dosage Given: 18 Gy
Session Number: 1

## 2023-05-03 ENCOUNTER — Ambulatory Visit: Payer: Medicare Other | Admitting: Radiation Oncology

## 2023-05-06 ENCOUNTER — Ambulatory Visit: Payer: Medicare Other | Admitting: Radiation Oncology

## 2023-05-06 ENCOUNTER — Other Ambulatory Visit: Payer: Self-pay

## 2023-05-06 ENCOUNTER — Ambulatory Visit
Admission: RE | Admit: 2023-05-06 | Discharge: 2023-05-06 | Disposition: A | Discharge status: Home / Self Care | Payer: Medicare Other | Source: Ambulatory Visit | Attending: Radiation Oncology | Admitting: Radiation Oncology

## 2023-05-06 DIAGNOSIS — C3412 Malignant neoplasm of upper lobe, left bronchus or lung: Secondary | ICD-10-CM | POA: Diagnosis not present

## 2023-05-06 DIAGNOSIS — Z510 Encounter for antineoplastic radiation therapy: Secondary | ICD-10-CM | POA: Diagnosis not present

## 2023-05-06 LAB — RAD ONC ARIA SESSION SUMMARY
Course Elapsed Days: 4
Plan Fractions Treated to Date: 2
Plan Prescribed Dose Per Fraction: 18 Gy
Plan Total Fractions Prescribed: 3
Plan Total Prescribed Dose: 54 Gy
Reference Point Dosage Given to Date: 36 Gy
Reference Point Session Dosage Given: 18 Gy
Session Number: 2

## 2023-05-07 ENCOUNTER — Ambulatory Visit: Payer: Medicare Other | Admitting: Radiation Oncology

## 2023-05-09 ENCOUNTER — Ambulatory Visit: Payer: Medicare Other | Admitting: Radiation Oncology

## 2023-05-10 ENCOUNTER — Ambulatory Visit: Payer: Medicare Other

## 2023-05-10 ENCOUNTER — Other Ambulatory Visit: Payer: Self-pay

## 2023-05-10 ENCOUNTER — Ambulatory Visit: Payer: Medicare Other | Admitting: Radiation Oncology

## 2023-05-10 ENCOUNTER — Ambulatory Visit
Admission: RE | Admit: 2023-05-10 | Discharge: 2023-05-10 | Disposition: A | Discharge status: Home / Self Care | Payer: Medicare Other | Source: Ambulatory Visit | Attending: Radiation Oncology | Admitting: Radiation Oncology

## 2023-05-10 DIAGNOSIS — C3412 Malignant neoplasm of upper lobe, left bronchus or lung: Secondary | ICD-10-CM | POA: Insufficient documentation

## 2023-05-10 DIAGNOSIS — Z510 Encounter for antineoplastic radiation therapy: Secondary | ICD-10-CM | POA: Insufficient documentation

## 2023-05-10 DIAGNOSIS — F17210 Nicotine dependence, cigarettes, uncomplicated: Secondary | ICD-10-CM | POA: Diagnosis not present

## 2023-05-10 LAB — RAD ONC ARIA SESSION SUMMARY
Course Elapsed Days: 8
Plan Fractions Treated to Date: 3
Plan Prescribed Dose Per Fraction: 18 Gy
Plan Total Fractions Prescribed: 3
Plan Total Prescribed Dose: 54 Gy
Reference Point Dosage Given to Date: 54 Gy
Reference Point Session Dosage Given: 18 Gy
Session Number: 3

## 2023-05-13 ENCOUNTER — Ambulatory Visit: Payer: Medicare Other | Admitting: Radiation Oncology

## 2023-05-13 NOTE — Radiation Completion Notes (Addendum)
  Radiation Oncology         (336) [phone] ________________________________  Name: Brandon Mckinney MRN: [id number]  Date of Service: 05/10/2023  DOB: 1956-01-16  End of Treatment Note   Diagnosis: Stage IA2, cT1bN0M0, NSCLC, of the lingular LUL   Intent: Curative     ==========DELIVERED PLANS==========  First Treatment Date: 2023-05-02 Last Treatment Date: 2023-05-10   Plan Name: Lung_L_SBRT Site: Lung, Left Technique: SBRT/SRT-IMRT Mode: Photon Dose Per Fraction: 18 Gy Prescribed Dose (Delivered / Prescribed): 54 Gy / 54 Gy Prescribed Fxs (Delivered / Prescribed): 3 / 3     ==========ON TREATMENT VISIT DATES========== 2023-05-02, 2023-05-06, 2023-05-10, 2023-05-10    See weekly On Treatment Notes in Epic for details in the Media tab (listed as Progress notes on the On Treatment Visit Dates listed above). The patient tolerated radiation. He developed fatigue and anticipated skin changes in the treatment field.   The patient will receive a call in about one month from the radiation oncology department. He will continue follow up with Dr. Sherrod as well.      Donald KYM Husband, PAC

## 2023-06-10 ENCOUNTER — Ambulatory Visit
Admission: RE | Admit: 2023-06-10 | Discharge: 2023-06-10 | Disposition: A | Discharge status: Home / Self Care | Payer: Medicare Other | Source: Ambulatory Visit | Attending: Internal Medicine | Admitting: Internal Medicine

## 2023-06-10 NOTE — Progress Notes (Signed)
  Radiation Oncology         (336) [phone] ________________________________  Name: Brandon Mckinney MRN: [id number]  Date of Service: 06/10/2023  DOB: April 12, 1956  Post Treatment Telephone Note  Diagnosis:  Stage IA2, cT1bN0M0, NSCLC, of the lingular LUL (as documented in provider EOT note)  The patient was available for call today.   Symptoms of fatigue have improved since completing therapy.  Symptoms of skin changes have improved since completing therapy.  Symptoms of esophagitis have improved since completing therapy.  The patient has scheduled follow up with his medical oncologist Dr. Arbutus Ped for ongoing care, and was encouraged to call if he  develops concerns or questions regarding radiation.   This concludes the interaction.  Ruel Favors, LPN

## 2023-07-24 ENCOUNTER — Other Ambulatory Visit: Payer: Self-pay | Admitting: *Deleted

## 2023-07-24 DIAGNOSIS — C3412 Malignant neoplasm of upper lobe, left bronchus or lung: Secondary | ICD-10-CM

## 2023-07-25 ENCOUNTER — Inpatient Hospital Stay: Payer: Medicare Other | Attending: Physician Assistant

## 2023-07-25 ENCOUNTER — Inpatient Hospital Stay: Payer: Medicare Other | Admitting: Internal Medicine

## 2023-09-06 ENCOUNTER — Other Ambulatory Visit (HOSPITAL_COMMUNITY): Payer: Self-pay

## 2023-09-06 ENCOUNTER — Telehealth: Payer: Self-pay

## 2023-09-06 ENCOUNTER — Other Ambulatory Visit: Payer: Self-pay | Admitting: Pulmonary Disease

## 2023-09-06 DIAGNOSIS — Z8719 Personal history of other diseases of the digestive system: Secondary | ICD-10-CM

## 2023-09-06 NOTE — Telephone Encounter (Signed)
 This medication or product is on your plan's list of covered drugs. Prior authorization is not required at this time. If your pharmacy has questions regarding the processing of your prescription, please have them call the [facility] pharmacy help desk at [phone]. **Please note: This request was submitted electronically. Formulary lowering, tiering exception, cost reduction and/or pre-benefit determination review (including prospective Medicare hospice reviews) requests cannot be requested using this method of submission. Providers contact us at [phone] for further assistance.

## 2023-09-06 NOTE — Telephone Encounter (Signed)
 PA request has been Received. New Encounter has been or will be created for follow up. For additional info see Pharmacy Prior Auth telephone encounter from 05/02.

## 2023-09-06 NOTE — Telephone Encounter (Signed)
 PA, please advise on alternatives. Thanks

## 2023-09-11 ENCOUNTER — Other Ambulatory Visit (HOSPITAL_COMMUNITY): Payer: Self-pay

## 2023-09-17 ENCOUNTER — Ambulatory Visit: Payer: Medicare Other | Admitting: Emergency Medicine

## 2023-09-19 ENCOUNTER — Other Ambulatory Visit: Payer: Self-pay | Admitting: Pulmonary Disease

## 2023-09-23 ENCOUNTER — Ambulatory Visit: Admitting: Emergency Medicine

## 2023-11-19 ENCOUNTER — Encounter: Payer: Self-pay | Admitting: Emergency Medicine

## 2023-11-19 ENCOUNTER — Ambulatory Visit (INDEPENDENT_AMBULATORY_CARE_PROVIDER_SITE_OTHER): Admitting: Emergency Medicine

## 2023-11-19 ENCOUNTER — Ambulatory Visit: Payer: Self-pay | Admitting: Emergency Medicine

## 2023-11-19 ENCOUNTER — Ambulatory Visit (INDEPENDENT_AMBULATORY_CARE_PROVIDER_SITE_OTHER)

## 2023-11-19 VITALS — Ht 69.0 in | Wt 147.0 lb

## 2023-11-19 VITALS — BP 140/70 | HR 81 | Temp 98.0°F | Ht 69.0 in | Wt 142.0 lb

## 2023-11-19 DIAGNOSIS — Z1159 Encounter for screening for other viral diseases: Secondary | ICD-10-CM

## 2023-11-19 DIAGNOSIS — F17200 Nicotine dependence, unspecified, uncomplicated: Secondary | ICD-10-CM | POA: Diagnosis not present

## 2023-11-19 DIAGNOSIS — Z8719 Personal history of other diseases of the digestive system: Secondary | ICD-10-CM

## 2023-11-19 DIAGNOSIS — Z125 Encounter for screening for malignant neoplasm of prostate: Secondary | ICD-10-CM

## 2023-11-19 DIAGNOSIS — Z0000 Encounter for general adult medical examination without abnormal findings: Secondary | ICD-10-CM

## 2023-11-19 DIAGNOSIS — E041 Nontoxic single thyroid nodule: Secondary | ICD-10-CM | POA: Diagnosis not present

## 2023-11-19 DIAGNOSIS — J439 Emphysema, unspecified: Secondary | ICD-10-CM | POA: Diagnosis not present

## 2023-11-19 DIAGNOSIS — K219 Gastro-esophageal reflux disease without esophagitis: Secondary | ICD-10-CM

## 2023-11-19 DIAGNOSIS — C3412 Malignant neoplasm of upper lobe, left bronchus or lung: Secondary | ICD-10-CM

## 2023-11-19 DIAGNOSIS — Z1211 Encounter for screening for malignant neoplasm of colon: Secondary | ICD-10-CM

## 2023-11-19 DIAGNOSIS — I700 Atherosclerosis of aorta: Secondary | ICD-10-CM | POA: Diagnosis not present

## 2023-11-19 DIAGNOSIS — Z0100 Encounter for examination of eyes and vision without abnormal findings: Secondary | ICD-10-CM

## 2023-11-19 LAB — CBC WITH DIFFERENTIAL/PLATELET
Basophils Absolute: 0.1 K/uL (ref 0.0–0.1)
Basophils Relative: 0.7 % (ref 0.0–3.0)
Eosinophils Absolute: 0 K/uL (ref 0.0–0.7)
Eosinophils Relative: 0.7 % (ref 0.0–5.0)
HCT: 39.9 % (ref 39.0–52.0)
Hemoglobin: 13.2 g/dL (ref 13.0–17.0)
Lymphocytes Relative: 34 % (ref 12.0–46.0)
Lymphs Abs: 2.4 K/uL (ref 0.7–4.0)
MCHC: 33.1 g/dL (ref 30.0–36.0)
MCV: 93.2 fl (ref 78.0–100.0)
Monocytes Absolute: 0.5 K/uL (ref 0.1–1.0)
Monocytes Relative: 6.9 % (ref 3.0–12.0)
Neutro Abs: 4 K/uL (ref 1.4–7.7)
Neutrophils Relative %: 57.7 % (ref 43.0–77.0)
Platelets: 304 K/uL (ref 150.0–400.0)
RBC: 4.27 Mil/uL (ref 4.22–5.81)
RDW: 14.9 % (ref 11.5–15.5)
WBC: 7 K/uL (ref 4.0–10.5)

## 2023-11-19 LAB — COMPREHENSIVE METABOLIC PANEL WITH GFR
ALT: 12 U/L (ref 0–53)
AST: 20 U/L (ref 0–37)
Albumin: 4.3 g/dL (ref 3.5–5.2)
Alkaline Phosphatase: 74 U/L (ref 39–117)
BUN: 10 mg/dL (ref 6–23)
CO2: 31 meq/L (ref 19–32)
Calcium: 9.2 mg/dL (ref 8.4–10.5)
Chloride: 103 meq/L (ref 96–112)
Creatinine, Ser: 0.87 mg/dL (ref 0.40–1.50)
GFR: 87.49 mL/min (ref >60.00)
Glucose, Bld: 77 mg/dL (ref 70–99)
Potassium: 3.9 meq/L (ref 3.5–5.1)
Sodium: 140 meq/L (ref 135–145)
Total Bilirubin: 0.6 mg/dL (ref 0.2–1.2)
Total Protein: 7.2 g/dL (ref 6.0–8.3)

## 2023-11-19 LAB — TSH: TSH: 1.29 u[IU]/mL (ref 0.35–5.50)

## 2023-11-19 LAB — PSA: PSA: 0.81 ng/mL (ref 0.10–4.00)

## 2023-11-19 LAB — HEMOGLOBIN A1C: Hgb A1c MFr Bld: 5.6 % (ref 4.6–6.5)

## 2023-11-19 MED ORDER — OMEPRAZOLE 40 MG PO CPDR: 40.0000 mg | DELAYED_RELEASE_CAPSULE | Freq: Every day | ORAL | 3 refills | Status: AC

## 2023-11-19 NOTE — Assessment & Plan Note (Signed)
 Clinically stable and asymptomatic at present time

## 2023-11-19 NOTE — Patient Instructions (Signed)
 Health Maintenance After Age 70 After age 4, you are at a higher risk for certain long-term diseases and infections as well as injuries from falls. Falls are a major cause of broken bones and head injuries in people who are older than age 47. Getting regular preventive care can help to keep you healthy and well. Preventive care includes getting regular testing and making lifestyle changes as recommended by your health care provider. Talk with your health care provider about: Which screenings and tests you should have. A screening is a test that checks for a disease when you have no symptoms. A diet and exercise plan that is right for you. What should I know about screenings and tests to prevent falls? Screening and testing are the best ways to find a health problem early. Early diagnosis and treatment give you the best chance of managing medical conditions that are common after age 37. Certain conditions and lifestyle choices may make you more likely to have a fall. Your health care provider may recommend: Regular vision checks. Poor vision and conditions such as cataracts can make you more likely to have a fall. If you wear glasses, make sure to get your prescription updated if your vision changes. Medicine review. Work with your health care provider to regularly review all of the medicines you are taking, including over-the-counter medicines. Ask your health care provider about any side effects that may make you more likely to have a fall. Tell your health care provider if any medicines that you take make you feel dizzy or sleepy. Strength and balance checks. Your health care provider may recommend certain tests to check your strength and balance while standing, walking, or changing positions. Foot health exam. Foot pain and numbness, as well as not wearing proper footwear, can make you more likely to have a fall. Screenings, including: Osteoporosis screening. Osteoporosis is a condition that causes  the bones to get weaker and break more easily. Blood pressure screening. Blood pressure changes and medicines to control blood pressure can make you feel dizzy. Depression screening. You may be more likely to have a fall if you have a fear of falling, feel depressed, or feel unable to do activities that you used to do. Alcohol use screening. Using too much alcohol can affect your balance and may make you more likely to have a fall. Follow these instructions at home: Lifestyle Do not drink alcohol if: Your health care provider tells you not to drink. If you drink alcohol: Limit how much you have to: 0-1 drink a day for women. 0-2 drinks a day for men. Know how much alcohol is in your drink. In the [place] one drink equals one 12 oz bottle of beer (355 mL), one 5 oz glass of wine (148 mL), or one 1 oz glass of hard liquor (44 mL). Do not use any products that contain nicotine or tobacco. These products include cigarettes, chewing tobacco, and vaping devices, such as e-cigarettes. If you need help quitting, ask your health care provider. Activity  Follow a regular exercise program to stay fit. This will help you maintain your balance. Ask your health care provider what types of exercise are appropriate for you. If you need a cane or walker, use it as recommended by your health care provider. Wear supportive shoes that have nonskid soles. Safety  Remove any tripping hazards, such as rugs, cords, and clutter. Install safety equipment such as grab bars in bathrooms and safety rails on stairs. Keep rooms and walkways  well-lit. General instructions Talk with your health care provider about your risks for falling. Tell your health care provider if: You fall. Be sure to tell your health care provider about all falls, even ones that seem minor. You feel dizzy, tiredness (fatigue), or off-balance. Take over-the-counter and prescription medicines only as told by your health care provider. These include  supplements. Eat a healthy diet and maintain a healthy weight. A healthy diet includes low-fat dairy products, low-fat (lean) meats, and fiber from whole grains, beans, and lots of fruits and vegetables. Stay current with your vaccines. Schedule regular health, dental, and eye exams. Summary Having a healthy lifestyle and getting preventive care can help to protect your health and wellness after age 11. Screening and testing are the best way to find a health problem early and help you avoid having a fall. Early diagnosis and treatment give you the best chance for managing medical conditions that are more common for people who are older than age 28. Falls are a major cause of broken bones and head injuries in people who are older than age 48. Take precautions to prevent a fall at home. Work with your health care provider to learn what changes you can make to improve your health and wellness and to prevent falls. This information is not intended to replace advice given to you by your health care provider. Make sure you discuss any questions you have with your health care provider. Document Revised: 09/12/2020 Document Reviewed: 09/12/2020 Elsevier Patient Education  2024 [employer].

## 2023-11-19 NOTE — Assessment & Plan Note (Signed)
 Biopsies of pulmonary nodules turned out to be negative No cancer found

## 2023-11-19 NOTE — Progress Notes (Signed)
 Brandon Mckinney 68 y.o.   Chief Complaint  Patient presents with   Follow-up    Patient here for 7 month f/u. Refill for omeprazole      HISTORY OF PRESENT ILLNESS: This is a 68 y.o. male here for follow-up of chronic medical conditions Overall doing well.  Has no complaints or medical concerns today. Continues to smoke.  HPI   Prior to Admission medications   Medication Sig Start Date End Date Taking? Authorizing Provider  albuterol  (VENTOLIN  HFA) 108 (90 Base) MCG/ACT inhaler INHALE 1 PUFF INTO THE LUNGS EVERY 6 HOURS AS NEEDED FOR WHEEZING OR SHORTNESS OF BREATH. 09/19/23  Yes Mannam, Praveen, MD  budesonide -formoterol  (SYMBICORT ) 160-4.5 MCG/ACT inhaler Inhale 2 puffs into the lungs in the morning and at bedtime. 01/28/23 01/28/24 Yes Mannam, Praveen, MD  omeprazole  (PRILOSEC) 40 MG capsule Take 1 capsule (40 mg total) by mouth daily. 01/28/23 01/28/24 Yes Mannam, Praveen, MD  Tiotropium Bromide  Monohydrate (SPIRIVA  RESPIMAT) 2.5 MCG/ACT AERS Inhale 2 puffs into the lungs daily. 02/27/23  Yes Shelah Lamar RAMAN, MD  Varenicline  Tartrate, Starter, 0.5 MG X 11 & 1 MG X 42 TBPK TAKE 1 TABLET BY MOUTH EVERY DAY Patient not taking: Reported on 11/19/2023 04/12/23   Mannam, Praveen, MD    No Known Allergies  Patient Active Problem List   Diagnosis Date Noted   Malignant neoplasm of upper lobe of left lung (HCC) 02/27/2023   GERD (gastroesophageal reflux disease) 02/27/2023   Pulmonary emphysema (HCC) 09/27/2020   Thyroid  nodule 09/27/2020   Current smoker 09/27/2020   Atherosclerosis of aorta (HCC) 09/27/2020    Past Medical History:  Diagnosis Date   COPD (chronic obstructive pulmonary disease) (HCC)    GERD (gastroesophageal reflux disease)    Heart murmur    per patient no one MD has said anything about in years   Lung cancer [facility])     Past Surgical History:  Procedure Laterality Date   BRONCHIAL BIOPSY  03/11/2023   Procedure: BRONCHIAL BIOPSIES;  Surgeon: Shelah Lamar RAMAN, MD;   Location: [facility] ENDOSCOPY;  Service: Pulmonary;;   BRONCHIAL BRUSHINGS  03/11/2023   Procedure: BRONCHIAL BRUSHINGS;  Surgeon: Shelah Lamar RAMAN, MD;  Location: [facility] ENDOSCOPY;  Service: Pulmonary;;   BRONCHIAL NEEDLE ASPIRATION BIOPSY  03/11/2023   Procedure: BRONCHIAL NEEDLE ASPIRATION BIOPSIES;  Surgeon: Shelah Lamar RAMAN, MD;  Location: [facility] ENDOSCOPY;  Service: Pulmonary;;   FIDUCIAL MARKER PLACEMENT  03/11/2023   Procedure: FIDUCIAL MARKER PLACEMENT;  Surgeon: Shelah Lamar RAMAN, MD;  Location: [facility] ENDOSCOPY;  Service: Pulmonary;;   PENECTOMY      Social History   Socioeconomic History   Marital status: Married    Spouse name: Not on file   Number of children: Not on file   Years of education: Not on file   Highest education level: Not on file  Occupational History   Not on file  Tobacco Use   Smoking status: Every Day    Current packs/day: 1.00    Average packs/day: 1 pack/day for 49.5 years (49.5 ttl pk-yrs)    Types: Cigarettes    Start date: 1976   Smokeless tobacco: Never   Tobacco comments:    Less then 1pk daily/07/11/22  Vaping Use   Vaping status: Never Used  Substance and Sexual Activity   Alcohol use: Never   Drug use: Never   Sexual activity: Yes  Other Topics Concern   Not on file  Social History Narrative   Not on file   Social Drivers of  Health   Financial Resource Strain: Low Risk  (11/19/2023)   Overall Financial Resource Strain (CARDIA)    Difficulty of Paying Living Expenses: Not hard at all  Food Insecurity: No Food Insecurity (11/19/2023)   Hunger Vital Sign    Worried About Running Out of Food in the Last Year: Never true    Ran Out of Food in the Last Year: Never true  Transportation Needs: No Transportation Needs (11/19/2023)   PRAPARE - Administrator, Civil Service (Medical): No    Lack of Transportation (Non-Medical): No  Physical Activity: Insufficiently Active (11/19/2023)   Exercise Vital Sign    Days of Exercise per Week: 5 days     Minutes of Exercise per Session: 20 min  Stress: No Stress Concern Present (11/19/2023)   [employer] of Occupational Health - Occupational Stress Questionnaire    Feeling of Stress: Not at all  Social Connections: Moderately Isolated (11/19/2023)   Social Connection and Isolation Panel    Frequency of Communication with Friends and Family: Once a week    Frequency of Social Gatherings with Friends and Family: Once a week    Attends Religious Services: 1 to 4 times per year    Active Member of [employer] or Organizations: No    Attends Banker Meetings: Never    Marital Status: Married  Catering manager Violence: Not At Risk (11/19/2023)   Humiliation, Afraid, Rape, and Kick questionnaire    Fear of Current or Ex-Partner: No    Emotionally Abused: No    Physically Abused: No    Sexually Abused: No    History reviewed. No pertinent family history.   Review of Systems  Constitutional: Negative.  Negative for chills and fever.  HENT: Negative.  Negative for congestion and sore throat.   Respiratory: Negative.  Negative for cough and shortness of breath.   Cardiovascular: Negative.  Negative for chest pain and palpitations.  Gastrointestinal:  Negative for abdominal pain, diarrhea, nausea and vomiting.  Genitourinary: Negative.  Negative for dysuria and hematuria.  Skin: Negative.  Negative for rash.  Neurological: Negative.  Negative for dizziness and headaches.  All other systems reviewed and are negative.   Vitals:   11/19/23 0939  BP: (!) 140/70  Pulse: 81  Temp: 98 F (36.7 C)  SpO2: 96%    Physical Exam Vitals reviewed.  Constitutional:      Appearance: Normal appearance.  HENT:     Head: Normocephalic.     Mouth/Throat:     Mouth: Mucous membranes are moist.     Pharynx: Oropharynx is clear.  Eyes:     Extraocular Movements: Extraocular movements intact.     Pupils: Pupils are equal, round, and reactive to light.  Cardiovascular:     Rate and  Rhythm: Normal rate and regular rhythm.     Pulses: Normal pulses.     Heart sounds: Normal heart sounds.  Pulmonary:     Effort: Pulmonary effort is normal.     Breath sounds: Normal breath sounds.  Abdominal:     Palpations: Abdomen is soft.     Tenderness: There is no abdominal tenderness.  Musculoskeletal:     Cervical back: No tenderness.     Right lower leg: No edema.     Left lower leg: No edema.  Lymphadenopathy:     Cervical: No cervical adenopathy.  Skin:    General: Skin is warm and dry.     Capillary Refill: Capillary refill takes less  than 2 seconds.  Neurological:     General: No focal deficit present.     Mental Status: He is alert and oriented to person, place, and time.  Psychiatric:        Mood and Affect: Mood normal.        Behavior: Behavior normal.      ASSESSMENT & PLAN: A total of 43 minutes was spent with the patient and counseling/coordination of care regarding preparing for this visit, review of most recent office visit notes, review of multiple chronic medical conditions and their management, review of all medications, review of most recent bloodwork results, review of health maintenance items, education on nutrition, prognosis, documentation, and need for follow up.   Problem List Items Addressed This Visit       Cardiovascular and Mediastinum   Atherosclerosis of aorta (HCC)   Diet and nutrition discussed Lipid profile done today Not taking rosuvastatin  The 10-year ASCVD risk score (Arnett DK, et al., 2019) is: 21.5%   Values used to calculate the score:     Age: 25 years     Clincally relevant sex: Male     Is Non-Hispanic African American: Yes     Diabetic: No     Tobacco smoker: Yes     Systolic Blood Pressure: 140 mmHg     Is BP treated: No     HDL Cholesterol: 49 mg/dL     Total Cholesterol: 164 mg/dL         Respiratory   Pulmonary emphysema (HCC) - Primary   Clinically stable.  No complications No concerns  today. Continues Symbicort  2 puffs twice daily and Spiriva  2 puffs daily Albuterol  as rescue inhaler No recent flareups      Malignant neoplasm of upper lobe of left lung (HCC)   Biopsies of pulmonary nodules turned out to be negative No cancer found        Digestive   GERD (gastroesophageal reflux disease)   Clinically stable and asymptomatic at present time        Endocrine   Thyroid  nodule   Benign nodule. No treatment necessary at this time.         Other   Current smoker   Cardiovascular and cancer risks associated with smoking discussed Smoking cessation advice given      Other Visit Diagnoses       Screening for colon cancer         Need for hepatitis C screening test          Patient Instructions  Health Maintenance After Age 53 After age 74, you are at a higher risk for certain long-term diseases and infections as well as injuries from falls. Falls are a major cause of broken bones and head injuries in people who are older than age 7. Getting regular preventive care can help to keep you healthy and well. Preventive care includes getting regular testing and making lifestyle changes as recommended by your health care provider. Talk with your health care provider about: Which screenings and tests you should have. A screening is a test that checks for a disease when you have no symptoms. A diet and exercise plan that is right for you. What should I know about screenings and tests to prevent falls? Screening and testing are the best ways to find a health problem early. Early diagnosis and treatment give you the best chance of managing medical conditions that are common after age 83. Certain conditions and lifestyle choices may make  you more likely to have a fall. Your health care provider may recommend: Regular vision checks. Poor vision and conditions such as cataracts can make you more likely to have a fall. If you wear glasses, make sure to get your prescription  updated if your vision changes. Medicine review. Work with your health care provider to regularly review all of the medicines you are taking, including over-the-counter medicines. Ask your health care provider about any side effects that may make you more likely to have a fall. Tell your health care provider if any medicines that you take make you feel dizzy or sleepy. Strength and balance checks. Your health care provider may recommend certain tests to check your strength and balance while standing, walking, or changing positions. Foot health exam. Foot pain and numbness, as well as not wearing proper footwear, can make you more likely to have a fall. Screenings, including: Osteoporosis screening. Osteoporosis is a condition that causes the bones to get weaker and break more easily. Blood pressure screening. Blood pressure changes and medicines to control blood pressure can make you feel dizzy. Depression screening. You may be more likely to have a fall if you have a fear of falling, feel depressed, or feel unable to do activities that you used to do. Alcohol use screening. Using too much alcohol can affect your balance and may make you more likely to have a fall. Follow these instructions at home: Lifestyle Do not drink alcohol if: Your health care provider tells you not to drink. If you drink alcohol: Limit how much you have to: 0-1 drink a day for women. 0-2 drinks a day for men. Know how much alcohol is in your drink. In the [place] one drink equals one 12 oz bottle of beer (355 mL), one 5 oz glass of wine (148 mL), or one 1 oz glass of hard liquor (44 mL). Do not use any products that contain nicotine  or tobacco. These products include cigarettes, chewing tobacco, and vaping devices, such as e-cigarettes. If you need help quitting, ask your health care provider. Activity  Follow a regular exercise program to stay fit. This will help you maintain your balance. Ask your health care provider  what types of exercise are appropriate for you. If you need a cane or walker, use it as recommended by your health care provider. Wear supportive shoes that have nonskid soles. Safety  Remove any tripping hazards, such as rugs, cords, and clutter. Install safety equipment such as grab bars in bathrooms and safety rails on stairs. Keep rooms and walkways well-lit. General instructions Talk with your health care provider about your risks for falling. Tell your health care provider if: You fall. Be sure to tell your health care provider about all falls, even ones that seem minor. You feel dizzy, tiredness (fatigue), or off-balance. Take over-the-counter and prescription medicines only as told by your health care provider. These include supplements. Eat a healthy diet and maintain a healthy weight. A healthy diet includes low-fat dairy products, low-fat (lean) meats, and fiber from whole grains, beans, and lots of fruits and vegetables. Stay current with your vaccines. Schedule regular health, dental, and eye exams. Summary Having a healthy lifestyle and getting preventive care can help to protect your health and wellness after age 11. Screening and testing are the best way to find a health problem early and help you avoid having a fall. Early diagnosis and treatment give you the best chance for managing medical conditions that are more common for  people who are older than age 65. Falls are a major cause of broken bones and head injuries in people who are older than age 20. Take precautions to prevent a fall at home. Work with your health care provider to learn what changes you can make to improve your health and wellness and to prevent falls. This information is not intended to replace advice given to you by your health care provider. Make sure you discuss any questions you have with your health care provider. Document Revised: 09/12/2020 Document Reviewed: 09/12/2020 Elsevier Patient Education   2024 Elsevier Inc.    Emil Schaumann, MD [facility] at [facility]

## 2023-11-19 NOTE — Assessment & Plan Note (Signed)
 Clinically stable.  No complications No concerns today. Continues Symbicort 2 puffs twice daily and Spiriva 2 puffs daily Albuterol as rescue inhaler No recent flareups

## 2023-11-19 NOTE — Progress Notes (Signed)
 Subjective:   Brandon Mckinney is a 68 y.o. who presents for a Medicare Wellness preventive visit.  As a reminder, Annual Wellness Visits don't include a physical exam, and some assessments may be limited, especially if this visit is performed virtually. We may recommend an in-person follow-up visit with your provider if needed.  Visit Complete: Virtual I connected with  Brandon Mckinney on 11/19/23 by a audio enabled telemedicine application and verified that I am speaking with the correct person using two identifiers.  Patient Location: Home  Provider Location: Office/Clinic  I discussed the limitations of evaluation and management by telemedicine. The patient expressed understanding and agreed to proceed.  Vital Signs: Because this visit was a virtual/telehealth visit, some criteria may be missing or patient reported. Any vitals not documented were not able to be obtained and vitals that have been documented are patient reported.  VideoDeclined- This patient declined Librarian, academic. Therefore the visit was completed with audio only.  Persons Participating in Visit: Patient.  AWV Questionnaire: No: Patient Medicare AWV questionnaire was not completed prior to this visit.  Cardiac Risk Factors include: advanced age (>41men, >48 women);male gender;smoking/ tobacco exposure     Objective:    Today's Vitals   11/19/23 0901  Weight: 147 lb (66.7 kg)  Height: 5' 9 (1.753 m)   Body mass index is 21.71 kg/m.     11/19/2023    9:00 AM 03/28/2023    8:48 AM 03/11/2023    8:25 AM 09/17/2022   10:01 AM 09/08/2021    9:49 AM  Advanced Directives  Does Patient Have a Medical Advance Directive? No No No No No  Would patient like information on creating a medical advance directive? No - Patient declined No - Patient declined  No - Patient declined No - Patient declined    Current Medications (verified) Outpatient Encounter Medications as of 11/19/2023   Medication Sig   albuterol  (VENTOLIN  HFA) 108 (90 Base) MCG/ACT inhaler INHALE 1 PUFF INTO THE LUNGS EVERY 6 HOURS AS NEEDED FOR WHEEZING OR SHORTNESS OF BREATH.   budesonide -formoterol  (SYMBICORT ) 160-4.5 MCG/ACT inhaler Inhale 2 puffs into the lungs in the morning and at bedtime.   omeprazole  (PRILOSEC) 40 MG capsule Take 1 capsule (40 mg total) by mouth daily.   Tiotropium Bromide  Monohydrate (SPIRIVA  RESPIMAT) 2.5 MCG/ACT AERS Inhale 2 puffs into the lungs daily.   Varenicline  Tartrate, Starter, 0.5 MG X 11 & 1 MG X 42 TBPK TAKE 1 TABLET BY MOUTH EVERY DAY   No facility-administered encounter medications on file as of 11/19/2023.    Allergies (verified) Patient has no known allergies.   History: Past Medical History:  Diagnosis Date   COPD (chronic obstructive pulmonary disease) (HCC)    GERD (gastroesophageal reflux disease)    Heart murmur    per patient no one MD has said anything about in years   Lung cancer [facility])    Past Surgical History:  Procedure Laterality Date   BRONCHIAL BIOPSY  03/11/2023   Procedure: BRONCHIAL BIOPSIES;  Surgeon: Shelah Lamar RAMAN, MD;  Location: [facility] ENDOSCOPY;  Service: Pulmonary;;   BRONCHIAL BRUSHINGS  03/11/2023   Procedure: BRONCHIAL BRUSHINGS;  Surgeon: Shelah Lamar RAMAN, MD;  Location[facility] ENDOSCOPY;  Service: Pulmonary;;   BRONCHIAL NEEDLE ASPIRATION BIOPSY  03/11/2023   Procedure: BRONCHIAL NEEDLE ASPIRATION BIOPSIES;  Surgeon: Shelah Lamar RAMAN, MD;  Location: [facility] ENDOSCOPY;  Service: Pulmonary;;   FIDUCIAL MARKER PLACEMENT  03/11/2023   Procedure: FIDUCIAL MARKER PLACEMENT;  Surgeon:  Shelah Lamar RAMAN, MD;  Location[facility] ENDOSCOPY;  Service: Pulmonary;;   PENECTOMY     History reviewed. No pertinent family history. Social History   Socioeconomic History   Marital status: Married    Spouse name: Not on file   Number of children: Not on file   Years of education: Not on file   Highest education level: Not on file  Occupational History   Not on  file  Tobacco Use   Smoking status: Every Day    Current packs/day: 1.00    Average packs/day: 1 pack/day for 49.5 years (49.5 ttl pk-yrs)    Types: Cigarettes    Start date: 30   Smokeless tobacco: Never   Tobacco comments:    Less then 1pk daily/07/11/22  Vaping Use   Vaping status: Never Used  Substance and Sexual Activity   Alcohol use: Never   Drug use: Never   Sexual activity: Yes  Other Topics Concern   Not on file  Social History Narrative   Not on file   Social Drivers of Health   Financial Resource Strain: Low Risk  (11/19/2023)   Overall Financial Resource Strain (CARDIA)    Difficulty of Paying Living Expenses: Not hard at all  Food Insecurity: No Food Insecurity (11/19/2023)   Hunger Vital Sign    Worried About Running Out of Food in the Last Year: Never true    Ran Out of Food in the Last Year: Never true  Transportation Needs: No Transportation Needs (11/19/2023)   PRAPARE - Administrator, Civil Service (Medical): No    Lack of Transportation (Non-Medical): No  Physical Activity: Insufficiently Active (11/19/2023)   Exercise Vital Sign    Days of Exercise per Week: 5 days    Minutes of Exercise per Session: 20 min  Stress: No Stress Concern Present (11/19/2023)   [employer] of Occupational Health - Occupational Stress Questionnaire    Feeling of Stress: Not at all  Social Connections: Moderately Isolated (11/19/2023)   Social Connection and Isolation Panel    Frequency of Communication with Friends and Family: Once a week    Frequency of Social Gatherings with Friends and Family: Once a week    Attends Religious Services: 1 to 4 times per year    Active Member of [employer] or Organizations: No    Attends Engineer, structural: Never    Marital Status: Married    Tobacco Counseling Ready to quit: No Counseling given: Yes Tobacco comments: Less then 1pk daily/07/11/22    Clinical Intake:  Pre-visit preparation completed:  Yes  Pain : No/denies pain     BMI - recorded: 21.71 Nutritional Status: BMI of 19-24  Normal Nutritional Risks: None Diabetes: No  Lab Results  Component Value Date   HGBA1C 5.4 03/20/2023     How often do you need to have someone help you when you read instructions, pamphlets, or other written materials from your doctor or pharmacy?: 1 - Never  Interpreter Needed?: No  Information entered by :: Verdie Saba, CMA   Activities of Daily Living     11/19/2023    9:03 AM  In your present state of health, do you have any difficulty performing the following activities:  Hearing? 0  Vision? 0  Difficulty concentrating or making decisions? 0  Walking or climbing stairs? 0  Dressing or bathing? 0  Doing errands, shopping? 0  Preparing Food and eating ? N  Using the Toilet? N  In the  past six months, have you accidently leaked urine? N  Do you have problems with loss of bowel control? N  Managing your Medications? N  Managing your Finances? N  Housekeeping or managing your Housekeeping? N    Patient Care Team: Purcell Emil Schanz, MD as PCP - General (Internal Medicine) [facility], [place] Bouchard, Duwaine BROCKS, RN as Oncology Nurse Navigator  I have updated your Care Teams any recent Medical Services you may have received from other providers in the past year.     Assessment:   This is a routine wellness examination for [facility].  Hearing/Vision screen Hearing Screening - Comments:: Denies hearing difficulties   Vision Screening - Comments:: Wears rx glasses - referral to Dr Heather [place]   Goals Addressed               This Visit's Progress     Patient Stated (pt-stated)        Patient stated he is just trying to take meds right and eat healthy       Depression Screen     11/19/2023    9:04 AM 03/28/2023    9:23 AM 03/20/2023   10:20 AM 09/17/2022   10:16 AM 09/17/2022   10:03 AM 11/22/2021    2:28 PM 09/08/2021    9:23 AM  PHQ 2/9 Scores  PHQ  - 2 Score 0 0 0 0 0  0  PHQ- 9 Score 0    0    Exception Documentation      Patient refusal     Fall Risk     11/19/2023    9:04 AM 03/20/2023   10:20 AM 09/17/2022   10:16 AM 09/17/2022   10:02 AM 09/08/2021    9:49 AM  Fall Risk   Falls in the past year? 0 0 0 0 0  Number falls in past yr: 0 0 0 0   Injury with Fall? 0 0 0 0   Risk for fall due to : No Fall Risks No Fall Risks Impaired vision No Fall Risks   Follow up Falls evaluation completed;Falls prevention discussed Falls evaluation completed Falls evaluation completed Falls prevention discussed     MEDICARE RISK AT HOME:  Medicare Risk at Home Any stairs in or around the home?: Yes If so, are there any without handrails?: No Home free of loose throw rugs in walkways, pet beds, electrical cords, etc?: Yes Adequate lighting in your home to reduce risk of falls?: Yes Life alert?: No Use of a cane, walker or w/c?: No Grab bars in the bathroom?: Yes Shower chair or bench in shower?: No Elevated toilet seat or a handicapped toilet?: No  TIMED UP AND GO:  Was the test performed?  No  Cognitive Function: 6CIT completed        11/19/2023    9:08 AM 09/17/2022   10:05 AM 09/08/2021    9:50 AM  6CIT Screen  What Year? 4 points 0 points 0 points  What month? 0 points 0 points 0 points  What time? 0 points 0 points 0 points  Count back from 20 0 points 0 points 0 points  Months in reverse 0 points 0 points 0 points  Repeat phrase 4 points 0 points 0 points  Total Score 8 points 0 points 0 points    Immunizations Immunization History  Administered Date(s) Administered   Fluad Quad(high Dose 65+) 04/18/2020   Fluad Trivalent(High Dose 65+) 01/28/2023   Janssen (J&J) SARS-COV-2 Vaccination 02/12/2020  PNEUMOCOCCAL CONJUGATE-20 09/17/2022    Screening Tests Health Maintenance  Topic Date Due   Hepatitis C Screening  Never done   Zoster Vaccines- Shingrix (1 of 2) Never done   Colonoscopy  Never done   COVID-19  Vaccine (2 - Janssen risk series) 03/11/2020   INFLUENZA VACCINE  12/06/2023   Medicare Annual Wellness (AWV)  11/18/2024   Pneumococcal Vaccine: 50+ Years  Completed   Hepatitis B Vaccines  Aged Out   HPV VACCINES  Aged Out   Meningococcal B Vaccine  Aged Out   Lung Cancer Screening  Discontinued   DTaP/Tdap/Td  Discontinued    Health Maintenance  Health Maintenance Due  Topic Date Due   Hepatitis C Screening  Never done   Zoster Vaccines- Shingrix (1 of 2) Never done   Colonoscopy  Never done   COVID-19 Vaccine (2 - Janssen risk series) 03/11/2020   Health Maintenance Items Addressed:  Referral sent to GI for colonoscopy, Referral sent to Optometry/Ophthalmology, Labs Ordered: Hepatitis C Screening  Additional Screening:  Vision Screening: Recommended annual ophthalmology exams for early detection of glaucoma and other disorders of the eye. Would you like a referral to an eye doctor? Yes  - referral to Dr Heather [place] for an eye exam  Dental Screening: Recommended annual dental exams for proper oral hygiene  Community Resource Referral / Chronic Care Management: CRR required this visit?  No   CCM required this visit?  No   Plan:    I have personally reviewed and noted the following in the patient's chart:   Medical and social history Use of alcohol, tobacco or illicit drugs  Current medications and supplements including opioid prescriptions. Patient is not currently taking opioid prescriptions. Functional ability and status Nutritional status Physical activity Advanced directives List of other physicians Hospitalizations, surgeries, and ER visits in previous 12 months Vitals Screenings to include cognitive, depression, and falls Referrals and appointments  In addition, I have reviewed and discussed with patient certain preventive protocols, quality metrics, and best practice recommendations. A written personalized care plan for preventive services as well as  general preventive health recommendations were provided to patient.   Verdie CHRISTELLA Saba, CMA   11/19/2023   After Visit Summary: (Declined) Due to this being a telephonic visit, with patients personalized plan was offered to patient but patient Declined AVS at this time   Notes: Pt's 6CIT Score = 8pts (high); Appt is today w/PCP.  Referral to Dr Abran for a Screening Colonoscopy.

## 2023-11-19 NOTE — Assessment & Plan Note (Signed)
 Cardiovascular and cancer risks associated with smoking discussed. Smoking cessation advice given.

## 2023-11-19 NOTE — Assessment & Plan Note (Signed)
 Diet and nutrition discussed Lipid profile done today Not taking rosuvastatin  The 10-year ASCVD risk score (Arnett DK, et al., 2019) is: 21.5%   Values used to calculate the score:     Age: 68 years     Clincally relevant sex: Male     Is Non-Hispanic African American: Yes     Diabetic: No     Tobacco smoker: Yes     Systolic Blood Pressure: 140 mmHg     Is BP treated: No     HDL Cholesterol: 49 mg/dL     Total Cholesterol: 164 mg/dL

## 2023-11-19 NOTE — Patient Instructions (Addendum)
 Mr. Brandon Mckinney , Thank you for taking time out of your busy schedule to complete your Annual Wellness Visit with me. I enjoyed our conversation and look forward to speaking with you again next year. I, as well as your care team,  appreciate your ongoing commitment to your health goals. Please review the following plan we discussed and let me know if I can assist you in the future. Your Game plan/ To Do List    Referrals: If you haven't heard from the office you've been referred to, please reach out to them at the phone provided.  Referral to Dr Abran (GI) for a Screening Colonoscopy; Referral to Dr Heather [place] for an eye exam; Ordered a Hepatitis C Screening (lab) Follow up Visits: Next Medicare AWV with our clinical staff: 11/20/2024   Have you seen your provider in the last 6 months (3 months if uncontrolled diabetes)? No Next Office Visit with your provider: 11/19/2023  Clinician Recommendations:  Aim for 30 minutes of exercise or brisk walking, 6-8 glasses of water, and 5 servings of fruits and vegetables each day. Educated and advised on getting the Shingles vaccines in 2025.      This is a list of the screening recommended for you and due dates:  Health Maintenance  Topic Date Due   Hepatitis C Screening  Never done   Zoster (Shingles) Vaccine (1 of 2) Never done   Colon Cancer Screening  Never done   COVID-19 Vaccine (2 - Janssen risk series) 03/11/2020   Flu Shot  12/06/2023   Medicare Annual Wellness Visit  11/18/2024   Pneumococcal Vaccine for age over 30  Completed   Hepatitis B Vaccine  Aged Out   HPV Vaccine  Aged Out   Meningitis B Vaccine  Aged Out   Screening for Lung Cancer  Discontinued   DTaP/Tdap/Td vaccine  Discontinued    Advanced directives: (Declined) Advance directive discussed with you today. Even though you declined this today, please call our office should you change your mind, and we can give you the proper paperwork for you to fill out. Advance Care Planning is  important because it:  [x]  Makes sure you receive the medical care that is consistent with your values, goals, and preferences  [x]  It provides guidance to your family and loved ones and reduces their decisional burden about whether or not they are making the right decisions based on your wishes.  Follow the link provided in your after visit summary or read over the paperwork we have mailed to you to help you started getting your Advance Directives in place. If you need assistance in completing these, please reach out to us  so that we can help you!

## 2023-11-19 NOTE — Assessment & Plan Note (Signed)
Benign nodule.  No treatment necessary at this time.

## 2023-11-20 LAB — HEPATITIS C ANTIBODY: Hepatitis C Ab: NONREACTIVE

## 2024-04-02 ENCOUNTER — Other Ambulatory Visit: Payer: Self-pay | Admitting: Pulmonary Disease

## 2024-04-24 ENCOUNTER — Other Ambulatory Visit: Payer: Self-pay | Admitting: Emergency Medicine

## 2024-05-21 ENCOUNTER — Ambulatory Visit: Admitting: Emergency Medicine

## 2024-11-20 ENCOUNTER — Ambulatory Visit
